# Patient Record
Sex: Male | Born: 1960 | Race: White | Hispanic: No | Marital: Married | Smoking: Current every day smoker
Health system: Southern US, Community
[De-identification: ages and names within clinical notes are randomized; demographics above are authoritative.]

## PROBLEM LIST (undated history)

## (undated) DIAGNOSIS — I10 Essential (primary) hypertension: Secondary | ICD-10-CM

## (undated) DIAGNOSIS — G510 Bell's palsy: Secondary | ICD-10-CM

## (undated) HISTORY — PX: TONSILLECTOMY: SUR1361

---

## 2005-06-25 ENCOUNTER — Ambulatory Visit: Payer: Self-pay | Admitting: Family Medicine

## 2005-06-29 ENCOUNTER — Ambulatory Visit: Payer: Self-pay | Admitting: Family Medicine

## 2005-07-14 ENCOUNTER — Ambulatory Visit: Payer: Self-pay | Admitting: Family Medicine

## 2010-05-31 ENCOUNTER — Emergency Department (HOSPITAL_COMMUNITY)
Admission: EM | Admit: 2010-05-31 | Discharge: 2010-05-31 | Payer: Self-pay | Source: Home / Self Care | Admitting: Emergency Medicine

## 2014-12-17 ENCOUNTER — Emergency Department: Payer: Self-pay

## 2014-12-17 ENCOUNTER — Encounter: Payer: Self-pay | Admitting: Emergency Medicine

## 2014-12-17 ENCOUNTER — Other Ambulatory Visit: Payer: Self-pay

## 2014-12-17 ENCOUNTER — Emergency Department
Admission: EM | Admit: 2014-12-17 | Discharge: 2014-12-17 | Disposition: A | Discharge status: Home / Self Care | Payer: Self-pay | Attending: Emergency Medicine | Admitting: Emergency Medicine

## 2014-12-17 DIAGNOSIS — Z880 Allergy status to penicillin: Secondary | ICD-10-CM | POA: Insufficient documentation

## 2014-12-17 DIAGNOSIS — R0789 Other chest pain: Secondary | ICD-10-CM | POA: Insufficient documentation

## 2014-12-17 LAB — TROPONIN I
Troponin I: <0.03 ng/mL (ref <0.031)
Troponin I: <0.03 ng/mL (ref <0.031)

## 2014-12-17 LAB — BASIC METABOLIC PANEL
ANION GAP: 10 (ref 5–15)
BUN: 14 mg/dL (ref 6–20)
CALCIUM: 10.3 mg/dL (ref 8.9–10.3)
CO2: 24 mmol/L (ref 22–32)
CREATININE: 0.91 mg/dL (ref 0.61–1.24)
Chloride: 105 mmol/L (ref 101–111)
Glucose, Bld: 121 mg/dL — ABNORMAL HIGH (ref 65–99)
Potassium: 3.7 mmol/L (ref 3.5–5.1)
SODIUM: 139 mmol/L (ref 135–145)

## 2014-12-17 LAB — CBC
HCT: 44.9 % (ref 40.0–52.0)
HEMOGLOBIN: 15.7 g/dL (ref 13.0–18.0)
MCH: 34.6 pg — ABNORMAL HIGH (ref 26.0–34.0)
MCHC: 35 g/dL (ref 32.0–36.0)
MCV: 98.7 fL (ref 80.0–100.0)
Platelets: 225 10*3/uL (ref 150–440)
RBC: 4.54 MIL/uL (ref 4.40–5.90)
RDW: 13.1 % (ref 11.5–14.5)
WBC: 6.9 10*3/uL (ref 3.8–10.6)

## 2014-12-17 MED ORDER — ASPIRIN 81 MG PO CHEW
162.0000 mg | CHEWABLE_TABLET | Freq: Once | ORAL | Status: AC
  Administered 2014-12-17: 162 mg via ORAL

## 2014-12-17 MED ORDER — ASPIRIN 81 MG PO CHEW
CHEWABLE_TABLET | ORAL | Status: AC
  Administered 2014-12-17: 162 mg via ORAL
  Filled 2014-12-17: qty 2

## 2014-12-17 NOTE — ED Provider Notes (Signed)
[facility] Emergency Department Provider Note ____________________________________________  Time seen: Approximately 3:36 PM  I have reviewed the triage vital signs and the nursing notes.   HISTORY  Chief Complaint Chest Pain    HPI Dale Bentley is a 54 y.o. male with history of hypertension, not recently treated, who presents for evaluation of left-sided chest pain that began last night at 10 PM while he was eating a piece of fried chicken. The pain came on gradually and became sharp. He also had reflux symptoms with a sour taste in his mouth that resolved after he drank tea. It was associated with diaphoresis so he took a shower. He denied associated shortness of breath or nausea. When the chest pain resolved he couldn't get comfortable because he felt restless. He also noted abdominal bloating and discomfort. He pushed on his abdomen and said it "felt tight" this has subsequently resolved. He finally fell asleep at 6 AM then had to wake shortly after for work. Did not have chest pain at that time but when he got to work he was hot and sweaty. He works as a [employer] fast streamer and was inside a house with poor air conditioning. Then around 10 AM he developed left-sided chest pain that he described as "light" as if he could feel "something there "; he states it lasted "no time "and is pain-free at this time. He denied chest pressure or tightness with the episodes. The pain has never radiated. He has never had similar symptoms. He did take a BC powder this morning. He did not eat breakfast because he was worried his stomach was too tight which is unusual for him as he normally eats breakfast every day.  He has felt recently well and denies any fever, URI, night sweats. He does smoke. His father had an MI around this age and required angioplasty and a stent 10 years later.  History reviewed. No pertinent past medical history. patient notes he was treated for  hypertension a long time ago with HCTZ and lisinopril but was unable to tolerate the side effects. He has not seen a doctor in many years.  There are no active problems to display for this patient.   History reviewed. No pertinent past surgical history.  No current outpatient prescriptions on file.  Allergies Penicillins  History reviewed. No pertinent family history.  Social History History  Substance Use Topics  . Smoking status: Never Smoker   . Smokeless tobacco: Never Used  . Alcohol Use: Yes    Review of Systems Constitutional: No fever/chills ENT: No URI Musculoskeletal: Negative for back pain. Neurological: Negative for headaches Endocrine:No recent weight change 10-point ROS otherwise negative.  ____________________________________________   PHYSICAL EXAM:  VITAL SIGNS: ED Triage Vitals  Enc Vitals Group     BP 12/17/14 1203 164/99 mmHg     Pulse Rate 12/17/14 1203 86     Resp 12/17/14 1203 20     Temp 12/17/14 1203 97.4 F (36.3 C)     Temp Source 12/17/14 1203 Oral     SpO2 12/17/14 1203 97 %     Weight 12/17/14 1203 205 lb (92.987 kg)     Height 12/17/14 1203 6' (1.829 m)     Head Cir --      Peak Flow --      Pain Score 12/17/14 1205 2     Pain Loc --      Pain Edu? --      Excl. in GC? --  Constitutional: Alert and oriented. Well appearing and in no acute distress. Eyes: Conjunctivae are normal. PERRL. EOMI. Head: Atraumatic. Nose: No congestion/rhinnorhea. Mouth/Throat: Mucous membranes are slightly dry.  Oropharynx non-erythematous. Neck: No stridor.   Lymphatic: No cervical lymphadenopathy. Cardiovascular: Normal rate, regular rhythm. Grossly normal heart sounds.  Peripheral pulses 2+ B Respiratory: Normal respiratory effort.  No retractions. Lungs CTAB. Gastrointestinal: Soft and nontender. No distention. Normal bowel sounds.  Musculoskeletal: No lower extremity tenderness nor edema.  No calf TTP. Neurologic:  Normal speech and  language. No gross focal neurologic deficits are appreciated. Speech is normal.  Skin:  Skin is warm, dry and intact. No rash noted. Psychiatric: Mood and affect are normal. Speech and behavior are normal.  ____________________________________________   LABS (all labs ordered are listed, but only abnormal results are displayed)  Labs Reviewed  CBC - Abnormal; Notable for the following:    MCH 34.6 (*)    All other components within normal limits  BASIC METABOLIC PANEL - Abnormal; Notable for the following:    Glucose, Bld 121 (*)    All other components within normal limits  TROPONIN I  TROPONIN I   ____________________________________________  EKG   Date: 12/17/2014; 1204  Rate: 82  Rhythm: normal sinus rhythm  QRS Axis: normal  Intervals: normal  ST/T Wave abnormalities: normal  Conduction Disutrbances: none  Narrative Interpretation: unremarkable  ____________________________________________  RADIOLOGY  CXR-NAD ____________________________________________   PROCEDURES  Procedure(s) performed: none  Critical Care performed: none ____________________________________________   INITIAL IMPRESSION / ASSESSMENT AND PLAN / ED COURSE  Pertinent labs & imaging results that were available during my care of the patient were reviewed by me and considered in my medical decision making (see chart for details).  H&P atypical for ACS but will rule out with 2 sets of troponin given risk factors of smoking and family history and possible untreated hypertension.  Not c/w Aortic pathology. Unclear how long-standing HTN is; will defer Tx to outpt.  Sx more c/w GI pathology but given Hx, feel cardiac stress test outpt should be considered.  ----------------------------------------- 4:50 PM on 12/17/2014 -----------------------------------------  Spoke with Dr. Lady Gary, [facility] cards, who will see patient in follow up Wednesday at 10:30 AM. Patient reassessed. He feels well and is  anxious to go home. Understands strict return precautions and need to return if pain recurs for further evaluation. ____________________________________________   FINAL CLINICAL IMPRESSION(S) / ED DIAGNOSES  Chest pain   Maurilio Lovely, MD 12/17/14 1655

## 2014-12-17 NOTE — Discharge Instructions (Signed)
Take Aspirin 162 mg daily until otherwise directed by Dr. Lady GaryFath.  Do not do strenuous activity until cleared by him.  Return to the ER if you have recurrent pain, sweating, difficulty breathing, nausea, or other concerns.  Keep your appointment with DR. Fath Weds at 10:30am.  Chest Pain (Nonspecific) It is often hard to give a specific diagnosis for the cause of chest pain. There is always a chance that your pain could be related to something serious, such as a heart attack or a blood clot in the lungs. You need to follow up with your health care provider for further evaluation. CAUSES   Heartburn.  Pneumonia or bronchitis.  Anxiety or stress.  Inflammation around your heart (pericarditis) or lung (pleuritis or pleurisy).  A blood clot in the lung.  A collapsed lung (pneumothorax). It can develop suddenly on its own (spontaneous pneumothorax) or from trauma to the chest.  Shingles infection (herpes zoster virus). The chest wall is composed of bones, muscles, and cartilage. Any of these can be the source of the pain.  The bones can be bruised by injury.  The muscles or cartilage can be strained by coughing or overwork.  The cartilage can be affected by inflammation and become sore (costochondritis). DIAGNOSIS  Lab tests or other studies may be needed to find the cause of your pain. Your health care provider may have you take a test called an ambulatory electrocardiogram (ECG). An ECG records your heartbeat patterns over a 24-hour period. You may also have other tests, such as:  Transthoracic echocardiogram (TTE). During echocardiography, sound waves are used to evaluate how blood flows through your heart.  Transesophageal echocardiogram (TEE).  Cardiac monitoring. This allows your health care provider to monitor your heart rate and rhythm in real time.  Holter monitor. This is a portable device that records your heartbeat and can help diagnose heart arrhythmias. It allows your health  care provider to track your heart activity for several days, if needed.  Stress tests by exercise or by giving medicine that makes the heart beat faster. TREATMENT   Treatment depends on what may be causing your chest pain. Treatment may include:  Acid blockers for heartburn.  Anti-inflammatory medicine.  Pain medicine for inflammatory conditions.  Antibiotics if an infection is present.  You may be advised to change lifestyle habits. This includes stopping smoking and avoiding alcohol, caffeine, and chocolate.  You may be advised to keep your head raised (elevated) when sleeping. This reduces the chance of acid going backward from your stomach into your esophagus. Most of the time, nonspecific chest pain will improve within 2-3 days with rest and mild pain medicine.  HOME CARE INSTRUCTIONS   If antibiotics were prescribed, take them as directed. Finish them even if you start to feel better.  For the next few days, avoid physical activities that bring on chest pain. Continue physical activities as directed.  Do not use any tobacco products, including cigarettes, chewing tobacco, or electronic cigarettes.  Avoid drinking alcohol.  Only take medicine as directed by your health care provider.  Follow your health care provider's suggestions for further testing if your chest pain does not go away.  Keep any follow-up appointments you made. If you do not go to an appointment, you could develop lasting (chronic) problems with pain. If there is any problem keeping an appointment, call to reschedule. SEEK MEDICAL CARE IF:   Your chest pain does not go away, even after treatment.  You have a rash  with blisters on your chest. °· You have a fever. °SEEK IMMEDIATE MEDICAL CARE IF:  °· You have increased chest pain or pain that spreads to your arm, neck, jaw, back, or abdomen. °· You have shortness of breath. °· You have an increasing cough, or you cough up blood. °· You have severe back or  abdominal pain. °· You feel nauseous or vomit. °· You have severe weakness. °· You faint. °· You have chills. °This is an emergency. Do not wait to see if the pain will go away. Get medical help at once. Call your local emergency services (911 in [place] Do not drive yourself to the hospital. °MAKE SURE YOU:  °· Understand these instructions. °· Will watch your condition. °· Will get help right away if you are not doing well or get worse. °Document Released: 03/18/2005 Document Revised: 06/13/2013 Document Reviewed: 01/12/2008 °[employer]® Patient Information ©2015 [employer]. This information is not intended to replace advice given to you by your health care provider. Make sure you discuss any questions you have with your health care provider. ° °

## 2014-12-17 NOTE — ED Notes (Signed)
Patient to ED with c/o left of center chest pain that started last night and lasted most of the night. Reports he was unable to get comfortable for most of the night and did get diaphoretic at one point. Reports that this morning pain is much less than it was.

## 2014-12-26 ENCOUNTER — Other Ambulatory Visit: Payer: Self-pay

## 2014-12-26 ENCOUNTER — Emergency Department: Payer: Self-pay

## 2014-12-26 ENCOUNTER — Encounter: Payer: Self-pay | Admitting: General Practice

## 2014-12-26 ENCOUNTER — Emergency Department
Admission: EM | Admit: 2014-12-26 | Discharge: 2014-12-26 | Disposition: A | Discharge status: Home / Self Care | Payer: Self-pay | Attending: Emergency Medicine | Admitting: Emergency Medicine

## 2014-12-26 DIAGNOSIS — R0602 Shortness of breath: Secondary | ICD-10-CM | POA: Insufficient documentation

## 2014-12-26 DIAGNOSIS — Z720 Tobacco use: Secondary | ICD-10-CM | POA: Insufficient documentation

## 2014-12-26 DIAGNOSIS — Z880 Allergy status to penicillin: Secondary | ICD-10-CM | POA: Insufficient documentation

## 2014-12-26 DIAGNOSIS — Z79899 Other long term (current) drug therapy: Secondary | ICD-10-CM | POA: Insufficient documentation

## 2014-12-26 DIAGNOSIS — R0789 Other chest pain: Secondary | ICD-10-CM | POA: Insufficient documentation

## 2014-12-26 DIAGNOSIS — Z7982 Long term (current) use of aspirin: Secondary | ICD-10-CM | POA: Insufficient documentation

## 2014-12-26 HISTORY — DX: Essential (primary) hypertension: I10

## 2014-12-26 LAB — BASIC METABOLIC PANEL
ANION GAP: 14 (ref 5–15)
BUN: 15 mg/dL (ref 6–20)
CALCIUM: 10.4 mg/dL — AB (ref 8.9–10.3)
CO2: 22 mmol/L (ref 22–32)
Chloride: 104 mmol/L (ref 101–111)
Creatinine, Ser: 0.98 mg/dL (ref 0.61–1.24)
GFR calc Af Amer: >60 mL/min (ref >60)
GFR calc non Af Amer: >60 mL/min (ref >60)
Glucose, Bld: 115 mg/dL — ABNORMAL HIGH (ref 65–99)
Potassium: 3.6 mmol/L (ref 3.5–5.1)
SODIUM: 140 mmol/L (ref 135–145)

## 2014-12-26 LAB — CBC
HCT: 46.8 % (ref 40.0–52.0)
Hemoglobin: 16 g/dL (ref 13.0–18.0)
MCH: 33.8 pg (ref 26.0–34.0)
MCHC: 34.3 g/dL (ref 32.0–36.0)
MCV: 98.7 fL (ref 80.0–100.0)
PLATELETS: 238 10*3/uL (ref 150–440)
RBC: 4.74 MIL/uL (ref 4.40–5.90)
RDW: 12.9 % (ref 11.5–14.5)
WBC: 8.9 10*3/uL (ref 3.8–10.6)

## 2014-12-26 LAB — TROPONIN I

## 2014-12-26 MED ORDER — IPRATROPIUM-ALBUTEROL 0.5-2.5 (3) MG/3ML IN SOLN
3.0000 mL | Freq: Once | RESPIRATORY_TRACT | Status: AC
  Administered 2014-12-26: 3 mL via RESPIRATORY_TRACT

## 2014-12-26 MED ORDER — GI COCKTAIL ~~LOC~~
ORAL | Status: AC
  Administered 2014-12-26: 30 mL via ORAL
  Filled 2014-12-26: qty 30

## 2014-12-26 MED ORDER — SODIUM CHLORIDE 0.9 % IV BOLUS (SEPSIS)
1000.0000 mL | Freq: Once | INTRAVENOUS | Status: AC
  Administered 2014-12-26: 1000 mL via INTRAVENOUS

## 2014-12-26 MED ORDER — FAMOTIDINE 20 MG PO TABS
40.0000 mg | ORAL_TABLET | Freq: Once | ORAL | Status: AC
  Administered 2014-12-26: 40 mg via ORAL

## 2014-12-26 MED ORDER — SUCRALFATE 1 G PO TABS: 1.0000 g | ORAL_TABLET | Freq: Four times a day (QID) | ORAL | Status: DC | PRN

## 2014-12-26 MED ORDER — LISINOPRIL 40 MG PO TABS: 40.0000 mg | ORAL_TABLET | Freq: Every day | ORAL | Status: DC

## 2014-12-26 MED ORDER — LISINOPRIL 20 MG PO TABS: 20.0000 mg | ORAL_TABLET | Freq: Every day | ORAL | Status: DC

## 2014-12-26 MED ORDER — RANITIDINE HCL 150 MG PO CAPS: 150.0000 mg | ORAL_CAPSULE | Freq: Two times a day (BID) | ORAL | Status: DC

## 2014-12-26 MED ORDER — FAMOTIDINE 20 MG PO TABS
ORAL_TABLET | ORAL | Status: AC
  Administered 2014-12-26: 40 mg via ORAL
  Filled 2014-12-26: qty 2

## 2014-12-26 MED ORDER — IPRATROPIUM-ALBUTEROL 0.5-2.5 (3) MG/3ML IN SOLN
RESPIRATORY_TRACT | Status: AC
  Administered 2014-12-26: 3 mL via RESPIRATORY_TRACT
  Filled 2014-12-26: qty 9

## 2014-12-26 MED ORDER — GI COCKTAIL ~~LOC~~
30.0000 mL | ORAL | Status: AC
  Administered 2014-12-26: 30 mL via ORAL

## 2014-12-26 NOTE — ED Provider Notes (Addendum)
[facility] Emergency Department Provider Note  ____________________________________________  Time seen: 11:15 AM  I have reviewed the triage vital signs and the nursing notes.   HISTORY  Chief Complaint Shortness of Breath and Chest Pain    HPI Dale Bentley is a 54 y.o. male who complains of shortness of breath and left lower chest pain since this morning around 10:00. 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 He went down in the basement where there was lots of dust and other airborne irritants, after being in that environment for several minutes he began to feel short of breath. He left there immediately. He still feels little bit short of breath although it is improving.  She also reports pain in the epigastrium and left upper quadrant that radiates upward into the lower left chest. He does note that he likes eat a lot of acidic foods including dairy and tomatoes. The pain is not exertional and not pleuritic. It's burning. No diaphoresis or vomiting. No radiation other than as described. Episodic in moderate intensity  He started lisinopril for hypertension 1 week ago. He's been compliant with this therapy but his blood pressure remains elevated.       Past Medical History  Diagnosis Date  . Hypertension     There are no active problems to display for this patient.   History reviewed. No pertinent past surgical history.  Current Outpatient Rx  Name  Route  Sig  Dispense  Refill  . aspirin EC 81 MG tablet   Oral   Take 81 mg by mouth daily.         Marland Kitchen lisinopril (PRINIVIL,ZESTRIL) 20 MG tablet   Oral   Take 20 mg by mouth daily.      1   . ranitidine (ZANTAC) 150 MG capsule   Oral   Take 1 capsule (150 mg total) by mouth 2 (two) times daily.   60 capsule   0   . sucralfate (CARAFATE) 1 G tablet   Oral   Take 1 tablet (1 g total) by mouth 4 (four)  times daily as needed.   120 tablet   1     Allergies Penicillins  No family history on file.  Social History History  Substance Use Topics  . Smoking status: Current Every Day Smoker -- 1.00 packs/day    Types: Cigarettes  . Smokeless tobacco: Never Used  . Alcohol Use: Yes    Review of Systems  Constitutional: No fever or chills. No weight changes Eyes:No blurry vision or double vision.  ENT: No sore throat. Cardiovascular:  positive chest pain as above . Respiratory:  positive shortness of breath as above, no coughh. Gastrointestinal: Negative for abdominal pain, vomiting and diarrhea.  No BRBPR or melena. Genitourinary: Negative for dysuria, urinary retention, bloody urine, or difficulty urinating. Musculoskeletal: Negative for back pain. No joint swelling or pain. Skin: Negative for rash. Neurological: Negative for headaches, focal weakness or numbness. Psychiatric:No anxiety or depression.   Endocrine:No hot/cold intolerance, changes in energy, or sleep difficulty.  10-point ROS otherwise negative.  ____________________________________________   PHYSICAL EXAM:  VITAL SIGNS: ED Triage Vitals  Enc Vitals Group     BP 12/26/14 1125 161/105 mmHg     Pulse Rate 12/26/14 1125 91     Resp 12/26/14 1125 18     Temp 12/26/14 1125 97.5 F (36.4 C)  Temp Source 12/26/14 1125 Oral     SpO2 12/26/14 1125 100 %     Weight 12/26/14 1125 205 lb (92.987 kg)     Height 12/26/14 1125 5\' 10"  (1.778 m)     Head Cir --      Peak Flow --      Pain Score 12/26/14 1125 4     Pain Loc --      Pain Edu? --      Excl. in GC? --      Constitutional: Alert and oriented. Well appearing and in no distress. Eyes: No scleral icterus. No conjunctival pallor. PERRL. EOMI ENT   Head: Normocephalic and atraumatic.   Nose: No congestion/rhinnorhea. No septal hematoma   Mouth/Throat: MMM,  mildal erythema. No peritonsillar mass. No uvula shift.   Neck: No stridor.  No SubQ emphysema. No meningismus. Hematological/Lymphatic/Immunilogical: No cervical lymphadenopathy. Cardiovascular: RRR. Normal and symmetric distal pulses are present in all extremities. No murmurs, rubs, or gallops. Respiratory: Normal respiratory effort without tachypnea nor retractions. Breath sounds are clear and equal bilaterally. No wheezes/rales/rhonchi. GastrointestinalMild left upper quadrant tenderness. No distention. There is no CVA tenderness.  No rebound, rigidity, or guarding. Genitourinary: deferred Musculoskeletal: Nontender with normal range of motion in all extremities. No joint effusions.  No lower extremity tenderness.  No edema. Neurologic:   Normal speech and language.  CN 2-10 normal. Motor grossly intact. No pronator drift.  Normal gait. No gross focal neurologic deficits are appreciated.  Skin:  Skin is warm, dry and intact. No rash noted.  No petechiae, purpura, or bullae. Psychiatric: Mood and affect are normal. Speech and behavior are normal. Patient exhibits appropriate insight and judgment.  ____________________________________________    LABS (pertinent positives/negatives) (all labs ordered are listed, but only abnormal results are displayed) Labs Reviewed  BASIC METABOLIC PANEL - Abnormal; Notable for the following:    Glucose, Bld 115 (*)    Calcium 10.4 (*)    All other components within normal limits  CBC  TROPONIN I   ____________________________________________   EKG  Interpreted by me  Date: 12/26/2014  Rate: 88  Rhythm: normal sinus rhythm  QRS Axis: normal  Intervals: normal  ST/T Wave abnormalities: normal  Conduction Disutrbances: none  Narrative Interpretation: unremarkable      ____________________________________________    RADIOLOGY  Chest x-ray unremarkable   ____________________________________________   PROCEDURES  ____________________________________________   INITIAL IMPRESSION / ASSESSMENT AND PLAN  / ED COURSE  Pertinent labs & imaging results that were available during my care of the patient were reviewed by me and considered in my medical decision making (see chart for details).  Patient presents with symptoms of likely 2 different issues. This appears to be GERD and environmental exposure to inhalational Uritin such as dust. Based on history and physical and past medical history, the patient is at low risk for ACS PE TAD pneumothorax carditis mediastinitis. Heart score is low risk. We'll check labs and give antacids.   ----------------------------------------- 2:11 PM on 12/26/2014 -----------------------------------------  Patient feels much better, symptoms resolved. Abdomen is completely soft nontender nondistended and benign at this time. Low suspicion for biliary pathology or pancreatitis. This appears to be gastritis and acid reflux primarily, which has improved. I'll start him on a course of Carafate and Zantac have him follow-up with his cardial just Dr. Lady GaryFath.  I recommended the patient go ahead and double his lisinopril from 20 mg daily to 40 mg daily as he's been on it for a week  without much improvement in his blood pressure.  ____________________________________________   FINAL CLINICAL IMPRESSION(S) / ED DIAGNOSES  Final diagnoses:  Shortness of breath  Chronic essential hypertension Gastritis and gastroesophageal reflux disease   Sharman Cheek, MD 12/26/14 1412  Sharman Cheek, MD 12/26/14 [phone]

## 2014-12-26 NOTE — ED Notes (Signed)
Pt. Arrived to ed from home with reprots of experiencing SOB and left side chest pain. Pt alert and oriented. Anxious in triage. Pt reports discomfort started this AM. Pt. Verbalized pain as "just pain" Pt reports relief of pain since this AM.

## 2016-04-07 IMAGING — CR DG CHEST 1V PORT
1 series · 1 of 1 positions shown · non-contrast
Comparison: Chest x-ray 12/17/2014.

CLINICAL DATA: 53-year-old male with shortness of breath and
left-sided chest pain.

EXAM:
PORTABLE CHEST - 1 VIEW

[portable]
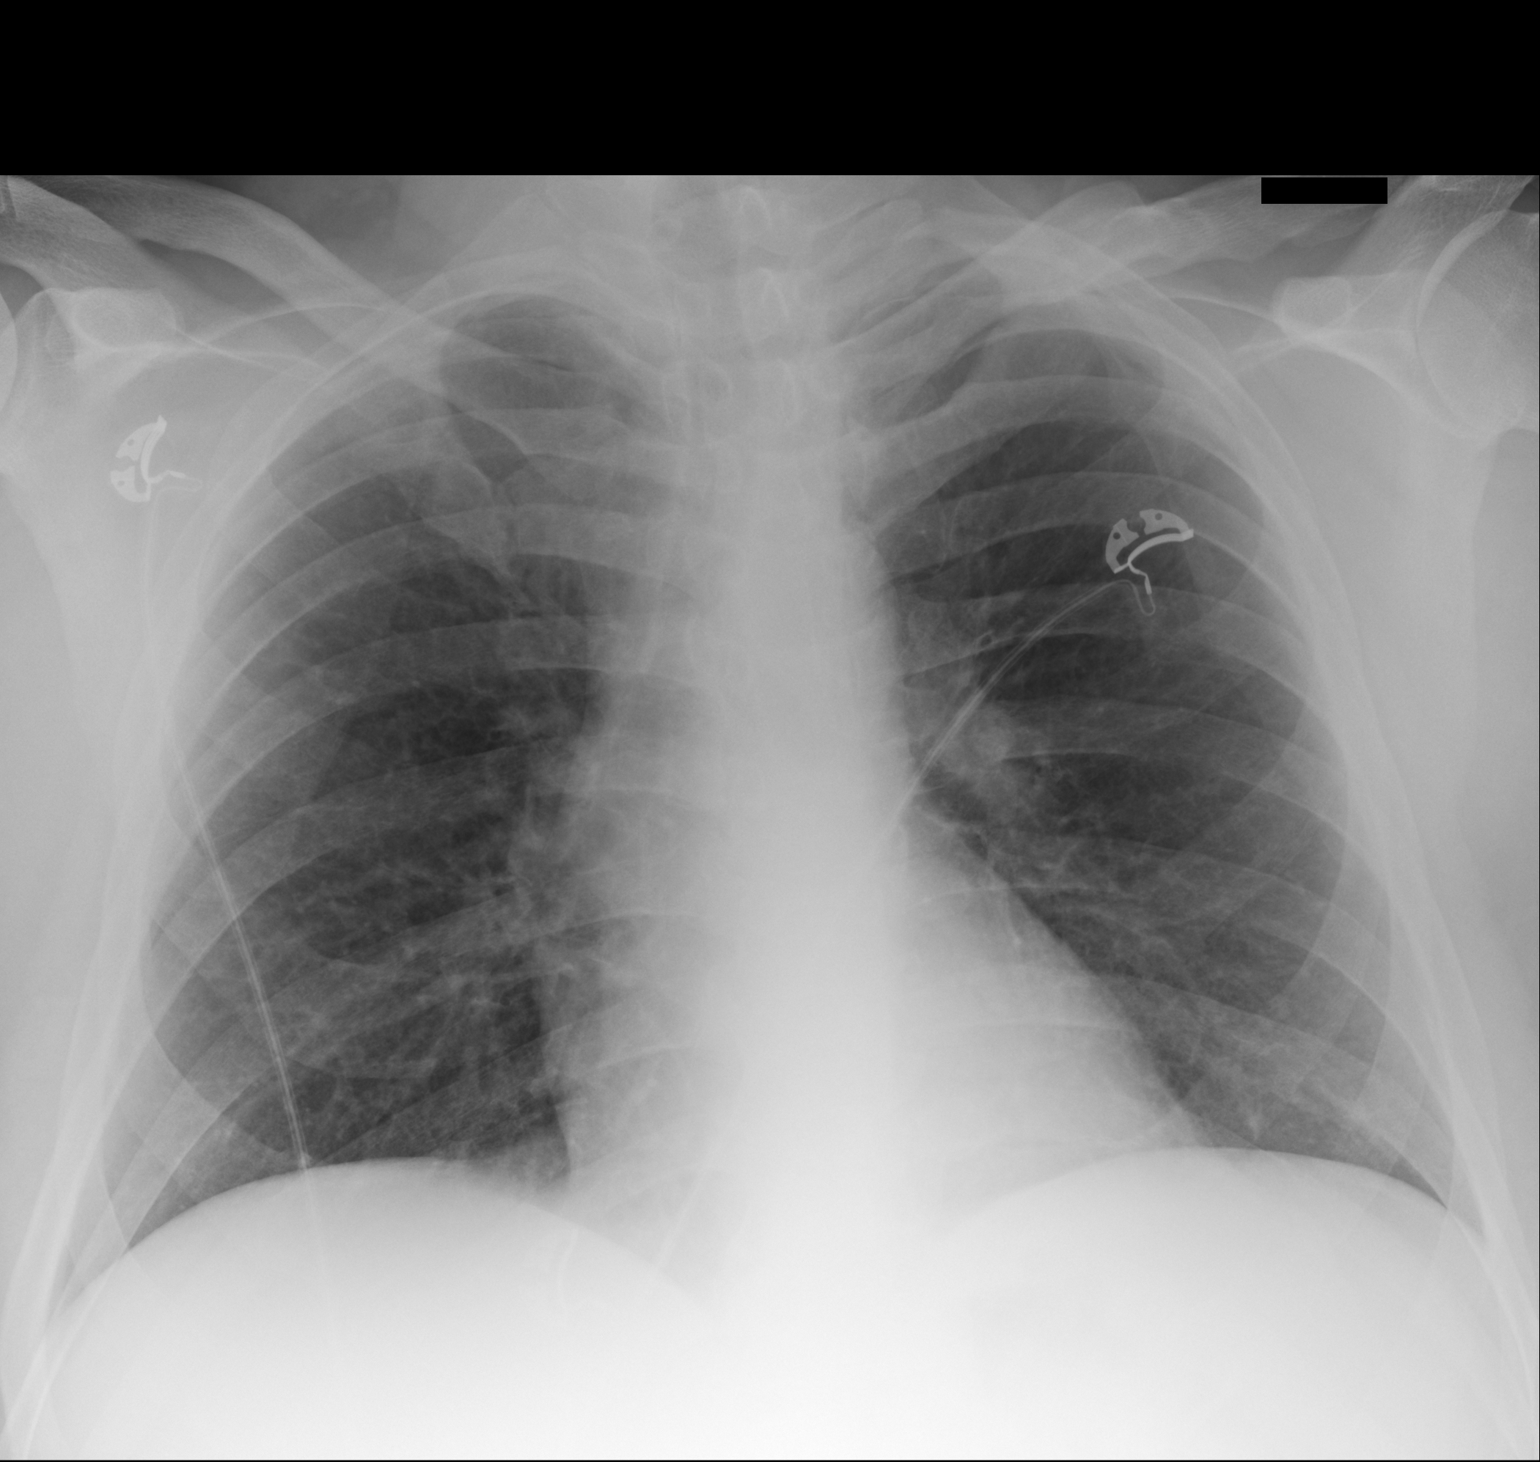

[1 of 1 positions shown; findings below may reference images not displayed]

FINDINGS: Lung volumes are normal. No consolidative airspace disease. No
pleural effusions. No pneumothorax. No pulmonary nodule or mass
noted. Pulmonary vasculature and the cardiomediastinal silhouette
are within normal limits.
IMPRESSION: No radiographic evidence of acute cardiopulmonary disease.

## 2018-04-08 ENCOUNTER — Emergency Department: Payer: Self-pay

## 2018-04-08 ENCOUNTER — Other Ambulatory Visit: Payer: Self-pay

## 2018-04-08 ENCOUNTER — Emergency Department
Admission: EM | Admit: 2018-04-08 | Discharge: 2018-04-08 | Disposition: A | Discharge status: Home / Self Care | Payer: Self-pay | Attending: Emergency Medicine | Admitting: Emergency Medicine

## 2018-04-08 ENCOUNTER — Encounter: Payer: Self-pay | Admitting: Emergency Medicine

## 2018-04-08 DIAGNOSIS — F17210 Nicotine dependence, cigarettes, uncomplicated: Secondary | ICD-10-CM | POA: Insufficient documentation

## 2018-04-08 DIAGNOSIS — W11XXXA Fall on and from ladder, initial encounter: Secondary | ICD-10-CM | POA: Insufficient documentation

## 2018-04-08 DIAGNOSIS — I10 Essential (primary) hypertension: Secondary | ICD-10-CM | POA: Insufficient documentation

## 2018-04-08 DIAGNOSIS — Y999 Unspecified external cause status: Secondary | ICD-10-CM | POA: Insufficient documentation

## 2018-04-08 DIAGNOSIS — Z79899 Other long term (current) drug therapy: Secondary | ICD-10-CM | POA: Insufficient documentation

## 2018-04-08 DIAGNOSIS — S92002A Unspecified fracture of left calcaneus, initial encounter for closed fracture: Secondary | ICD-10-CM | POA: Insufficient documentation

## 2018-04-08 DIAGNOSIS — S92009A Unspecified fracture of unspecified calcaneus, initial encounter for closed fracture: Secondary | ICD-10-CM

## 2018-04-08 DIAGNOSIS — Y929 Unspecified place or not applicable: Secondary | ICD-10-CM | POA: Insufficient documentation

## 2018-04-08 DIAGNOSIS — Z7982 Long term (current) use of aspirin: Secondary | ICD-10-CM | POA: Insufficient documentation

## 2018-04-08 DIAGNOSIS — Y9389 Activity, other specified: Secondary | ICD-10-CM | POA: Insufficient documentation

## 2018-04-08 MED ORDER — HYDROCODONE-ACETAMINOPHEN 5-325 MG PO TABS: 1.0000 | ORAL_TABLET | ORAL | 0 refills | Status: AC | PRN

## 2018-04-08 MED ORDER — MELOXICAM 7.5 MG PO TABS
15.0000 mg | ORAL_TABLET | Freq: Once | ORAL | Status: AC
  Administered 2018-04-08: 15 mg via ORAL
  Filled 2018-04-08: qty 2

## 2018-04-08 MED ORDER — OXYCODONE HCL 5 MG PO TABS
5.0000 mg | ORAL_TABLET | Freq: Once | ORAL | Status: AC
  Administered 2018-04-08: 5 mg via ORAL
  Filled 2018-04-08: qty 1

## 2018-04-08 NOTE — Discharge Instructions (Signed)
Keep the foot elevated above your heart.  Lay an ice pack over the splint but please be careful and not get the splint wet. Monday morning, call Dr. Irene Limbo office for an appointment. Return to the emergency department for any symptom of concern unless you are able to see someone in Dr. Irene Limbo office right away.

## 2018-04-08 NOTE — ED Triage Notes (Signed)
Pt arrived via POV with reports of fall from ladder, pt states the ladder started closing down and the patient went off backward, bending his left foot awkwardly.    Pt denies hitting his head and had no LOC.  Pt states he did hit his back, but does not have pain in his back. Pt unable to bear weight to left foot.

## 2018-04-08 NOTE — ED Notes (Signed)

## 2018-04-08 NOTE — ED Notes (Signed)
Pt states they own a pair of crutches and family is bringing them in for staff to set to correct height. Pt refused our crutches. This EDT will check crutches once other

## 2018-04-08 NOTE — ED Provider Notes (Signed)
[facility] Emergency Department Provider Note ____________________________________________  Time seen: Approximately 4:07 PM  I have reviewed the triage vital signs and the nursing notes.   HISTORY  Chief Complaint Ankle Pain    HPI ARLAND USERY is a 57 y.o. male who presents to the emergency department for evaluation and treatment of left heel pain.  He was up on a ladder and it started closing down and he landed awkwardly on his left foot then fell over onto his back.  He denies back pain.  No alleviating measures attempted prior to arrival.  No previous ankle fracture.  He denies striking his head or experiencing any loss of consciousness.  Past Medical History:  Diagnosis Date  . Hypertension     There are no active problems to display for this patient.   History reviewed. No pertinent surgical history.  Prior to Admission medications   Medication Sig Start Date End Date Taking? Authorizing Provider  aspirin EC 81 MG tablet Take 81 mg by mouth daily.    [provider]  HYDROcodone-acetaminophen (NORCO/VICODIN) 5-325 MG tablet Take 1 tablet by mouth every 4 (four) hours as needed for moderate pain. 04/08/18 04/08/19  Antoine Fiallos, Rulon Eisenmenger B, FNP  lisinopril (PRINIVIL,ZESTRIL) 20 MG tablet Take 1 tablet (20 mg total) by mouth daily. 12/26/14   Sharman Cheek, MD  lisinopril (PRINIVIL,ZESTRIL) 40 MG tablet Take 1 tablet (40 mg total) by mouth daily. 12/26/14   Sharman Cheek, MD  ranitidine (ZANTAC) 150 MG capsule Take 1 capsule (150 mg total) by mouth 2 (two) times daily. 12/26/14   Sharman Cheek, MD  sucralfate (CARAFATE) 1 G tablet Take 1 tablet (1 g total) by mouth 4 (four) times daily as needed. 12/26/14   Sharman Cheek, MD    Allergies Penicillins  No family history on file.  Social History Social History   Tobacco Use  . Smoking status: Current Every Day Smoker    Packs/day: 1.00    Types: Cigarettes  . Smokeless tobacco:  Never Used  Substance Use Topics  . Alcohol use: Yes  . Drug use: No    Review of Systems Constitutional: Negative for fever. Cardiovascular: Negative for chest pain. Respiratory: Negative for shortness of breath. Musculoskeletal: Positive for left foot and ankle pain. Skin: Positive for abrasion to the left forearm. Neurological: Negative for decrease in sensation  ____________________________________________   PHYSICAL EXAM:  VITAL SIGNS: ED Triage Vitals [04/08/18 1546]  Enc Vitals Group     BP (!) 149/103     Pulse Rate 85     Resp 20     Temp (!) 97.4 F (36.3 C)     Temp Source Oral     SpO2 100 %     Weight 215 lb (97.5 kg)     Height 5\' 11"  (1.803 m)     Head Circumference      Peak Flow      Pain Score 8     Pain Loc      Pain Edu?      Excl. in GC?     Constitutional: Alert and oriented. Well appearing and in no acute distress. Eyes: Conjunctivae are clear without discharge or drainage Head: Atraumatic Neck: No focal midline tenderness. Respiratory: No cough. Respirations are even and unlabored. Musculoskeletal: No focal midline tenderness of the lumbar or thoracic spine.  No pain on hip squeeze.  No pain with palpation over either femur or knee.  There is no focal tenderness over the head  of the fibula on either side.  No obvious bony abnormality of either ankle, foot, or toes.  Thompson's test of the left ower extremity is negative. Neurologic: Awake, alert, oriented x4. Skin: Superficial abrasion noted to the anterior aspect of the left forearm.  No active bleeding. Psychiatric: Affect and behavior are appropriate.  ____________________________________________   LABS (all labs ordered are listed, but only abnormal results are displayed)  Labs Reviewed - No data to display ____________________________________________  RADIOLOGY  Image of the left foot confirms suspected calcaneal fracture with an associated joint effusion and soft tissue  swelling. ____________________________________________   PROCEDURES  .Splint Application Date/Time: 04/08/2018 7:01 PM Performed by: Chinita Pester, FNP Authorized by: Chinita Pester, FNP   Consent:    Consent obtained:  Verbal   Consent given by:  Patient   Risks discussed:  Numbness, pain and swelling Pre-procedure details:    Sensation:  Normal Procedure details:    Laterality:  Left   Location:  Ankle   Ankle:  L ankle   Splint type:  Short leg   Supplies:  Elastic bandage, cotton padding and Ortho-Glass    ____________________________________________   INITIAL IMPRESSION / ASSESSMENT AND PLAN / ED COURSE  KOLTAN PORTOCARRERO is a 57 y.o. who presents to the emergency department for treatment and evaluation after falling while the ladder he was standing on collapsed.  Overall exam is reassuring, however there is concern for left calcaneal fracture.  We will start with x-rays.  ----------------------------------------- 4:49 PM on 04/08/2018 -----------------------------------------  Image results reviewed with the patient and coworker.  He is aware that he is scheduled to have a CT scan.  Because of the high axial load and calcaneal fracture images of the cervical, thoracic, lumbar spine have been ordered as well.  ----------------------------------------- 7:02 PM on 04/08/2018 -----------------------------------------  Case discussed with Dr. Ether Griffins who will see the patient as an outpatient early next week.  OCL applied per his instruction.  Patient will be discharged home with prescription for Norco.  He will be advised to keep the leg elevated above the heart and lay an ice pack over the [facility].  He was also strongly advised to avoid any attempt at weightbearing and will be given crutches.  He was advised to return to the emergency department for any symptom of concern if unable to see podiatry right away.  Medications  oxyCODONE (Oxy IR/ROXICODONE) immediate release  tablet 5 mg (5 mg Oral Given 04/08/18 1826)  meloxicam (MOBIC) tablet 15 mg (15 mg Oral Given 04/08/18 1826)    Pertinent labs & imaging results that were available during my care of the patient were reviewed by me and considered in my medical decision making (see chart for details).  _________________________________________   FINAL CLINICAL IMPRESSION(S) / ED DIAGNOSES  Final diagnoses:  Calcaneal fracture    ED Discharge Orders         Ordered    HYDROcodone-acetaminophen (NORCO/VICODIN) 5-325 MG tablet  Every 4 hours PRN     04/08/18 1910           If controlled substance prescribed during this visit, 12 month history viewed on the [facility] prior to issuing an initial prescription for Schedule II or III opiod.    Chinita Pester, FNP 04/08/18 Prentice Docker    Dionne Bucy, MD 04/08/18 2022

## 2018-04-08 NOTE — ED Notes (Signed)
Pt is waiting for his family to bring his crutches pt states he does not want to use ours

## 2020-10-26 ENCOUNTER — Encounter: Payer: Self-pay | Admitting: Intensive Care

## 2020-10-26 ENCOUNTER — Emergency Department
Admission: EM | Admit: 2020-10-26 | Discharge: 2020-10-26 | Disposition: A | Discharge status: Home / Self Care | Payer: Self-pay | Attending: Emergency Medicine | Admitting: Emergency Medicine

## 2020-10-26 ENCOUNTER — Other Ambulatory Visit: Payer: Self-pay

## 2020-10-26 ENCOUNTER — Emergency Department: Payer: Self-pay

## 2020-10-26 DIAGNOSIS — Z79899 Other long term (current) drug therapy: Secondary | ICD-10-CM | POA: Insufficient documentation

## 2020-10-26 DIAGNOSIS — F17210 Nicotine dependence, cigarettes, uncomplicated: Secondary | ICD-10-CM | POA: Insufficient documentation

## 2020-10-26 DIAGNOSIS — I10 Essential (primary) hypertension: Secondary | ICD-10-CM | POA: Insufficient documentation

## 2020-10-26 DIAGNOSIS — Z7982 Long term (current) use of aspirin: Secondary | ICD-10-CM | POA: Insufficient documentation

## 2020-10-26 DIAGNOSIS — G510 Bell's palsy: Secondary | ICD-10-CM | POA: Insufficient documentation

## 2020-10-26 LAB — CBC WITH DIFFERENTIAL/PLATELET
Abs Immature Granulocytes: 0.02 10*3/uL (ref 0.00–0.07)
Basophils Absolute: 0 10*3/uL (ref 0.0–0.1)
Basophils Relative: 0 %
Eosinophils Absolute: 0.1 10*3/uL (ref 0.0–0.5)
Eosinophils Relative: 1 %
HCT: 45 % (ref 39.0–52.0)
Hemoglobin: 16 g/dL (ref 13.0–17.0)
Immature Granulocytes: 0 %
Lymphocytes Relative: 20 %
Lymphs Abs: 1.6 10*3/uL (ref 0.7–4.0)
MCH: 34.5 pg — ABNORMAL HIGH (ref 26.0–34.0)
MCHC: 35.6 g/dL (ref 30.0–36.0)
MCV: 97 fL (ref 80.0–100.0)
Monocytes Absolute: 0.6 10*3/uL (ref 0.1–1.0)
Monocytes Relative: 7 %
Neutro Abs: 5.7 10*3/uL (ref 1.7–7.7)
Neutrophils Relative %: 72 %
Platelets: 197 10*3/uL (ref 150–400)
RBC: 4.64 MIL/uL (ref 4.22–5.81)
RDW: 12.6 % (ref 11.5–15.5)
WBC: 8.1 10*3/uL (ref 4.0–10.5)
nRBC: 0 % (ref 0.0–0.2)

## 2020-10-26 LAB — COMPREHENSIVE METABOLIC PANEL
ALT: 18 U/L (ref 0–44)
AST: 21 U/L (ref 15–41)
Albumin: 4.2 g/dL (ref 3.5–5.0)
Alkaline Phosphatase: 69 U/L (ref 38–126)
Anion gap: 10 (ref 5–15)
BUN: 15 mg/dL (ref 6–20)
CO2: 23 mmol/L (ref 22–32)
Calcium: 9.1 mg/dL (ref 8.9–10.3)
Chloride: 104 mmol/L (ref 98–111)
Creatinine, Ser: 0.79 mg/dL (ref 0.61–1.24)
GFR, Estimated: >60 mL/min (ref >60)
Glucose, Bld: 103 mg/dL — ABNORMAL HIGH (ref 70–99)
Potassium: 3.7 mmol/L (ref 3.5–5.1)
Sodium: 137 mmol/L (ref 135–145)
Total Bilirubin: 1 mg/dL (ref 0.3–1.2)
Total Protein: 7.6 g/dL (ref 6.5–8.1)

## 2020-10-26 MED ORDER — LISINOPRIL 10 MG PO TABS
20.0000 mg | ORAL_TABLET | Freq: Once | ORAL | Status: AC
  Administered 2020-10-26: 20 mg via ORAL
  Filled 2020-10-26 (×2): qty 2

## 2020-10-26 MED ORDER — PREDNISONE 20 MG PO TABS
60.0000 mg | ORAL_TABLET | Freq: Once | ORAL | Status: AC
  Administered 2020-10-26: 60 mg via ORAL
  Filled 2020-10-26: qty 3

## 2020-10-26 MED ORDER — LISINOPRIL 20 MG PO TABS: 20.0000 mg | ORAL_TABLET | Freq: Every day | ORAL | 0 refills | Status: DC

## 2020-10-26 MED ORDER — ACYCLOVIR 400 MG PO TABS: 400.0000 mg | ORAL_TABLET | Freq: Every day | ORAL | 0 refills | Status: AC

## 2020-10-26 MED ORDER — IOHEXOL 350 MG/ML SOLN
75.0000 mL | Freq: Once | INTRAVENOUS | Status: DC | PRN
  Filled 2020-10-26: qty 75

## 2020-10-26 MED ORDER — LORAZEPAM 2 MG/ML IJ SOLN
1.0000 mg | INTRAMUSCULAR | Status: DC | PRN
  Administered 2020-10-26: 1 mg via INTRAVENOUS
  Filled 2020-10-26: qty 1

## 2020-10-26 MED ORDER — PREDNISONE 20 MG PO TABS: 60.0000 mg | ORAL_TABLET | Freq: Every day | ORAL | 0 refills | Status: AC

## 2020-10-26 MED ORDER — ACYCLOVIR 200 MG PO CAPS
400.0000 mg | ORAL_CAPSULE | Freq: Once | ORAL | Status: AC
  Administered 2020-10-26: 400 mg via ORAL
  Filled 2020-10-26: qty 2

## 2020-10-26 NOTE — ED Notes (Signed)
Pt to ED c/o L facial droop that started at 0730 today. L face is "a little numb". Cannot close L eye. This has never happened before.

## 2020-10-26 NOTE — ED Notes (Signed)
Pt taken to MRI via stretcher.  

## 2020-10-26 NOTE — ED Notes (Signed)
Per Dr Scotty Court, no stroke labs at this time. Per [place], [place], due to pt BP, basic lab orders first.

## 2020-10-26 NOTE — ED Triage Notes (Signed)
Patient presents with left sided facial droop. Noticed by patient around 7:30am 10/26/2020. Denies numbness. No weakness noted. Speech clear.

## 2020-10-26 NOTE — ED Provider Notes (Signed)
[facility] Emergency Department Provider Note  ____________________________________________   Event Date/Time   First MD Initiated Contact with Patient 10/26/20 1603     (approximate)  I have reviewed the triage vital signs and the nursing notes.   HISTORY  Chief Complaint Facial Droop   HPI Dale Bentley is a 60 y.o. male who reports to the ER for evaluation of left-sided facial droop and mild numbness to the area.  He states he first noticed this around 730 this morning when he was eating breakfast at the table and noticed liquid rolling out of his mouth and he was unable to keep it in.  He also reports that this time he noticed that he could not completely close his left eye.  He denies any history of similar symptoms.  Denies a headache, blurred vision, dizziness, imbalance, weakness of any extremity.  He denies any history of blood clot, stroke, blood disorders.  He does have a significant history of hypertension, states that he has been out of his medicines "for a while".       Past Medical History:  Diagnosis Date  . Hypertension     There are no problems to display for this patient.   History reviewed. No pertinent surgical history.  Prior to Admission medications   Medication Sig Start Date End Date Taking? Authorizing Provider  acyclovir (ZOVIRAX) 400 MG tablet Take 1 tablet (400 mg total) by mouth 5 (five) times daily for 10 days. 10/26/20 11/05/20 Yes Nahjae Hoeg, Ruben Gottron, PA  lisinopril (ZESTRIL) 20 MG tablet Take 1 tablet (20 mg total) by mouth daily. 10/26/20 11/25/20 Yes Kaisy Severino, Ruben Gottron, PA  predniSONE (DELTASONE) 20 MG tablet Take 3 tablets (60 mg total) by mouth daily for 5 days. 10/26/20 10/31/20 Yes Lucy Chris, PA  aspirin EC 81 MG tablet Take 81 mg by mouth daily.    [provider]  ranitidine (ZANTAC) 150 MG capsule Take 1 capsule (150 mg total) by mouth 2 (two) times daily. 12/26/14   Sharman Cheek, MD  sucralfate  (CARAFATE) 1 G tablet Take 1 tablet (1 g total) by mouth 4 (four) times daily as needed. 12/26/14   Sharman Cheek, MD    Allergies Penicillins  History reviewed. No pertinent family history.  Social History Social History   Tobacco Use  . Smoking status: Current Every Day Smoker    Packs/day: 1.00    Types: Cigarettes  . Smokeless tobacco: Never Used  Substance Use Topics  . Alcohol use: Yes    Alcohol/week: 42.0 standard drinks    Types: 42 Cans of beer per week  . Drug use: Yes    Types: Cocaine    Review of Systems Constitutional: No fever/chills Eyes: No visual changes. ENT: No sore throat. Cardiovascular: Denies chest pain. Respiratory: Denies shortness of breath. Gastrointestinal: No abdominal pain.  No nausea, no vomiting.  No diarrhea.  No constipation. Genitourinary: Negative for dysuria. Musculoskeletal: Negative for back pain. Skin: Negative for rash. Neurological: + Left facial droop, negative for headaches   ____________________________________________   PHYSICAL EXAM:  VITAL SIGNS: ED Triage Vitals [10/26/20 1502]  Enc Vitals Group     BP (!) 203/114     Pulse Rate 86     Resp 18     Temp 97.7 F (36.5 C)     Temp Source Oral     SpO2 97 %     Weight 218 lb (98.9 kg)     Height 5\' 11"  (  1.803 m)     Head Circumference      Peak Flow      Pain Score 0     Pain Loc      Pain Edu?      Excl. in GC?    Constitutional: Alert and oriented. Well appearing and in no acute distress. Eyes: Conjunctivae are normal. PERRL. EOMI. patient is unable to completely close the left eye, right eye closes normally. Head: Atraumatic. Nose: Loss of nasolabial fold as described below.  No congestion/rhinnorhea. Mouth/Throat: Mucous membranes are moist.  Oropharynx non-erythematous. Neck: No stridor.  No tenderness to palpation of the midline or paraspinals of the cervical spine. Cardiovascular: Normal rate, regular rhythm. Grossly normal heart sounds.  Good  peripheral circulation. Respiratory: Normal respiratory effort.  No retractions. Lungs CTAB. Gastrointestinal: Soft and nontender. No distention. No abdominal bruits. No CVA tenderness. Musculoskeletal: 5/5 strength in the bilateral upper and lower extremities.  No lower extremity tenderness nor edema.  No joint effusions. Neurologic:  Normal speech and language.  The patient is unable to completely close the left eye, unable to raise the left eyebrow, unable to smile with the left facial musculature, there is loss of the nasolabial fold on the left.  Remainder of cranial nerve testing is within normal limits and deficits appear to be isolated to the facial nerve.  Neuromuscular testing of the upper and lower extremities is within normal limits.  No gait instability. Skin:  Skin is warm, dry and intact. No rash noted. Psychiatric: Mood and affect are normal. Speech and behavior are normal.  ____________________________________________   LABS (all labs ordered are listed, but only abnormal results are displayed)  Labs Reviewed  CBC WITH DIFFERENTIAL/PLATELET - Abnormal; Notable for the following components:      Result Value   MCH 34.5 (*)    All other components within normal limits  COMPREHENSIVE METABOLIC PANEL - Abnormal; Notable for the following components:   Glucose, Bld 103 (*)    All other components within normal limits    ____________________________________________  RADIOLOGY  MRI reported by radiology with no acute findings ____________________________________________   INITIAL IMPRESSION / ASSESSMENT AND PLAN / ED COURSE  As part of my medical decision making, I reviewed the following data within the electronic MEDICAL RECORD NUMBER Nursing notes reviewed and incorporated, Labs reviewed, Evaluated by EM attending Dr. Larinda Buttery and Notes from prior ED visits        Patient is a 60 year old male who presents to the emergency department for evaluation of left-sided facial  droop that he noticed early this morning while eating breakfast.  He declines history of similar, denies any weakness in the extremities.  In triage, patient was quite hypertensive with a pressure of 203/114.  Remainder of vitals was within normal limits.  He does have a noted history of hypertension, states he used to be on lisinopril 20 mg but has been out of this for "a while".  He does endorse wishing to be initiated on this medication again.  In terms of neurologic testing, the patient's extremities are all functioning appropriately without any noted weakness.  He does have noted left-sided facial weakness with difficulty closing left eye, unable to raise the left eyebrow, loss of the nasolabial fold on the left as well as inability to smile with the left side facial musculature.  Remainder of cranial nerve exam is within normal limits.  Differentials include Bell's palsy, ischemic stroke, hemorrhagic stroke, peripheral nerve mass  Given the patient's  hypertension, basic labs were initiated from triage with a CBC and CMP that are grossly unremarkable.  MRI was obtained to confirm no presence of stroke and this was negative.  Initiated treatment for Bell's palsy with steroid, antivirals.  I also initiated restarting the patient's lisinopril as I feel this is appropriate to control his blood pressure.  I will refer him to a local primary care where he can establish as he states he does not have one available to him.  Dr. Larinda Buttery also personally evaluated the patient and agreed with the patient's work-up and discharge disposition.  Return precautions were discussed with the patient and he stable this time for outpatient management.      ____________________________________________   FINAL CLINICAL IMPRESSION(S) / ED DIAGNOSES  Final diagnoses:  Left-sided Bell's palsy     ED Discharge Orders         Ordered    acyclovir (ZOVIRAX) 400 MG tablet  5 times daily        10/26/20 1910    predniSONE  (DELTASONE) 20 MG tablet  Daily        10/26/20 1910    lisinopril (ZESTRIL) 20 MG tablet  Daily        10/26/20 1910          *Please note:  Dale Bentley was evaluated in Emergency Department on 10/27/2020 for the symptoms described in the history of present illness. He was evaluated in the context of the global COVID-19 pandemic, which necessitated consideration that the patient might be at risk for infection with the SARS-CoV-2 virus that causes COVID-19. Institutional protocols and algorithms that pertain to the evaluation of patients at risk for COVID-19 are in a state of rapid change based on information released by regulatory bodies including the CDC and federal and state organizations. These policies and algorithms were followed during the patient's care in the ED.  Some ED evaluations and interventions may be delayed as a result of limited staffing during and the pandemic.*   Note:  This document was prepared using Dragon voice recognition software and may include unintentional dictation errors.   Lucy Chris, PA 10/27/20 2320    Chesley Noon, MD 10/29/20 [phone]

## 2020-10-26 NOTE — Discharge Instructions (Signed)
Please tape your eye closed for sleeping. During the waking hours, use Artificial Tears eye drops for comfort and to prevent eye injury. Take the other medications as prescribed. Follow up with primary care as referred above. Return to ER for any worsening.

## 2020-12-05 ENCOUNTER — Ambulatory Visit
Admission: EM | Admit: 2020-12-05 | Discharge: 2020-12-05 | Disposition: A | Discharge status: Home / Self Care | Payer: Self-pay | Attending: Internal Medicine | Admitting: Internal Medicine

## 2020-12-05 ENCOUNTER — Other Ambulatory Visit: Payer: Self-pay

## 2020-12-05 ENCOUNTER — Encounter: Payer: Self-pay | Admitting: Emergency Medicine

## 2020-12-05 DIAGNOSIS — H6123 Impacted cerumen, bilateral: Secondary | ICD-10-CM

## 2020-12-05 DIAGNOSIS — I10 Essential (primary) hypertension: Secondary | ICD-10-CM

## 2020-12-05 HISTORY — DX: Bell's palsy: G51.0

## 2020-12-05 LAB — POCT URINALYSIS DIP (MANUAL ENTRY)
Bilirubin, UA: NEGATIVE
Glucose, UA: NEGATIVE mg/dL
Ketones, POC UA: NEGATIVE mg/dL
Leukocytes, UA: NEGATIVE
Nitrite, UA: NEGATIVE
Protein Ur, POC: NEGATIVE mg/dL
Spec Grav, UA: >=1.03 — AB (ref 1.010–1.025)
Urobilinogen, UA: 0.2 E.U./dL
pH, UA: 5.5 (ref 5.0–8.0)

## 2020-12-05 MED ORDER — LISINOPRIL 10 MG PO TABS: ORAL_TABLET | ORAL | 0 refills | Status: DC

## 2020-12-05 MED ORDER — LISINOPRIL 10 MG PO TABS: 10.0000 mg | ORAL_TABLET | Freq: Every day | ORAL | 0 refills | Status: DC

## 2020-12-05 NOTE — Discharge Instructions (Addendum)
Call Dr Juel Burrow today and they will find you an appointment this month Take 3 lisinoprils at a time every morning Decrease your beer to only 2 per day max. Your urine shows microscopic blood, so please make sure this is checked again when you see your new provider.  Record your blood pressures 2-3 hours after you took your medication and take it to your new doctor.

## 2020-12-05 NOTE — ED Triage Notes (Signed)
Pt presents today with c/o of "having high blood pressure". He reports running out of his Lisinipril and does not have PCP. He has run out of Lisinipril 4 days.

## 2020-12-05 NOTE — ED Notes (Signed)
bilateral

## 2020-12-05 NOTE — ED Provider Notes (Signed)
[facility]    CSN: [id number] Arrival date & time: 12/05/20  0845      History   Chief Complaint Chief Complaint  Patient presents with   Hypertension    HPI Dale Bentley is a 60 y.o. male who presents due to running out of his lisinopril one week ago. Was seen in ER for Bell's Palsy and had HTN then, and was started on Lisinopril which he had been on in the past at the same dose. Since on it has felt more energetic and has had less HA's. While on Lisinopril 20 mg qd, his BP has been around 158/85. Today while working he bent over and felt light headed when he rose back up. Has mild pressure on his forehead, but denies chest pains, SOB, leg swelling. 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 Has never had lipids or HGB1C  done. He was supposed to get established with a PCP last month ,but he never called to make an apt. He went tere today and was told will take 2-3 months to get in and he did not make an apt.     Past Medical History:  Diagnosis Date   Bell's palsy    Hypertension     There are no problems to display for this patient.   History reviewed. No pertinent surgical history.     Home Medications    Prior to Admission medications   Medication Sig Start Date End Date Taking? Authorizing Provider  lisinopril (ZESTRIL) 10 MG tablet 3 qd in the am's 12/05/20   Rodriguez-Southworth, Nettie Elm, PA-C    Family History History reviewed. No pertinent family history.  Social History Social History   Tobacco Use   Smoking status: Every Day    Packs/day: 1.00    Pack years: 0.00    Types: Cigarettes   Smokeless tobacco: Never  Vaping Use   Vaping Use: Never used  Substance Use Topics   Alcohol use: Yes    Alcohol/week: 6.0 standard drinks    Types: 6 Cans of beer per week     Allergies   Penicillins   Review of Systems  Constitutional: Negative for diaphoresis and unexpected weight change.  HENT: Negative for tinnitus. R  ear feels stuffy  Eyes: Negative for visual disturbance.  Respiratory: Negative for chest tightness and shortness of breath.   Cardiovascular: Negative for chest pain, palpitations and leg swelling.  Gastrointestinal: Negative for constipation, diarrhea and nausea.  Endocrine: Negative for polydipsia, polyphagia and polyuria.  Genitourinary: Negative for dysuria and frequency.  Skin: Negative for rash and wound.  Neurological: Negative speech difficulty, weakness, numbness and positive for headaches and dizziness.    Physical Exam Triage Vital Signs ED Triage Vitals  Enc Vitals Group     BP 12/05/20 0856 (!) 194/108     Pulse Rate 12/05/20 0856 83     Resp 12/05/20 0856 18     Temp 12/05/20 0856 97.8 F (36.6 C)     Temp Source 12/05/20 0856 Oral     SpO2 12/05/20 0856 96 %     Weight --      Height --      Head Circumference --      Peak Flow --      Pain Score 12/05/20 0858 0     Pain Loc --      Pain Edu? --      Excl. in GC? --  No data found.  Updated Vital Signs BP (!) 172/94   Pulse 82   Temp 97.8 F (36.6 C) (Oral)   Resp 18   SpO2 96%   Visual Acuity Right Eye Distance:   Left Eye Distance:   Bilateral Distance:    Right Eye Near:   Left Eye Near:    Bilateral Near:     Physical Exam  Constitutional: he is oriented to person, place, and time. He appears well-developed and well-nourished. No distress.  HENT:  Head: Normocephalic and atraumatic.  Right Ear: canal with wax, and not fully removed after lavage. Left Ear: canal with cerumen, but TM is visible.  Nose: Nose normal.  Eyes: Conjunctivae are normal. Right eye exhibits no discharge. Left eye exhibits no discharge. No scleral icterus.  Neck: Neck supple. No thyromegaly present.  No carotid bruits bilaterally  Cardiovascular: Normal rate and regular rhythm.  No murmur heard. Pulmonary/Chest: Effort normal and breath sounds normal. No respiratory distress.  Musculoskeletal: Normal range of  motion. he exhibits no edema.  Lymphadenopathy: he has no cervical adenopathy.  Neurological: he is alert and oriented to person, place, and time.  Skin: Skin is warm and dry. Capillary refill takes less than 2 seconds. No rash noted. he is not diaphoretic.  Psychiatric: he has a normal mood and affect. His behavior is normal. Judgment and thought content normal.  Nursing note reviewed.   UC Treatments / Results  Labs (all labs ordered are listed, but only abnormal results are displayed) Labs Reviewed  POCT URINALYSIS DIP (MANUAL ENTRY) - Abnormal; Notable for the following components:      Result Value   Spec Grav, UA >=1.030 (*)    Blood, UA small (*)    All other components within normal limits    EKG   Radiology No results found.  Procedures Procedures (including critical care time)  Medications Ordered in UC Medications - No data to display  Initial Impression / Assessment and Plan / UC Course  I have reviewed the triage vital signs and the nursing notes. Pertinent labs  results that were available during my care of the patient were reviewed by me and considered in my medical decision making (see chart for details). Has HTN and cerumen impaction of both ears. Lavage was done and could hear better.  I placed him on Lisinopril 30 mg qd. I spoke with Dr Fredna Dow office and they can see him in a few week, so pt was given their information to call them today.  Advised to decrease his drinking to max 2 beers a day and d/c smoking Needs to have UA repeated due to mild microscopic hematuria when he sees new provider and will needs lipids and HGBA1C since his glucose have been elevated since 2016   Final Clinical Impressions(s) / UC Diagnoses   Final diagnoses:  Essential hypertension     Discharge Instructions      Call Dr Juel Burrow today and they will find you an appointment this month Take 3 lisinoprils at a time every morning Decrease your beer to only 2 per day  max. Your urine shows microscopic blood, so please make sure this is checked again when you see your new provider.  Record your blood pressures 2-3 hours after you took your medication and take it to your new doctor.      ED Prescriptions     Medication Sig Dispense Auth. Provider   lisinopril (ZESTRIL) 10 MG tablet  (Status: Discontinued) Take 1 tablet (  10 mg total) by mouth daily. 90 tablet Rodriguez-Southworth, Mavis Fichera, PA-C   lisinopril (ZESTRIL) 10 MG tablet 3 qd in the am's 90 tablet Rodriguez-Southworth, Nettie Elm, PA-C      PDMP not reviewed this encounter.   Garey Ham, [place] 12/05/20 [phone]

## 2020-12-16 ENCOUNTER — Ambulatory Visit (INDEPENDENT_AMBULATORY_CARE_PROVIDER_SITE_OTHER): Payer: Self-pay | Admitting: Internal Medicine

## 2020-12-16 ENCOUNTER — Other Ambulatory Visit: Payer: Self-pay

## 2020-12-16 ENCOUNTER — Encounter: Payer: Self-pay | Admitting: Internal Medicine

## 2020-12-16 VITALS — BP 187/92 | HR 80 | Ht 72.0 in | Wt 202.0 lb

## 2020-12-16 DIAGNOSIS — F17200 Nicotine dependence, unspecified, uncomplicated: Secondary | ICD-10-CM

## 2020-12-16 DIAGNOSIS — I10 Essential (primary) hypertension: Secondary | ICD-10-CM

## 2020-12-16 DIAGNOSIS — L03114 Cellulitis of left upper limb: Secondary | ICD-10-CM

## 2020-12-16 DIAGNOSIS — Z7141 Alcohol abuse counseling and surveillance of alcoholic: Secondary | ICD-10-CM

## 2020-12-16 MED ORDER — AMLODIPINE BESYLATE 5 MG PO TABS: 5.0000 mg | ORAL_TABLET | Freq: Every day | ORAL | 3 refills | Status: DC

## 2020-12-16 MED ORDER — DOXYCYCLINE HYCLATE 100 MG PO TABS: 100.0000 mg | ORAL_TABLET | Freq: Two times a day (BID) | ORAL | 0 refills | Status: DC

## 2020-12-16 NOTE — Assessment & Plan Note (Signed)

## 2020-12-16 NOTE — Assessment & Plan Note (Signed)
Patient was advised to stop drinking 

## 2020-12-16 NOTE — Assessment & Plan Note (Signed)
Patient was started on doxycycline. 

## 2020-12-16 NOTE — Assessment & Plan Note (Signed)
-   I instructed the patient to stop smoking and provided them with smoking cessation materials.  - I informed the patient that smoking puts them at increased risk for cancer, COPD, hypertension, and more.  - Informed the patient to seek help if they begin to have trouble breathing, develop chest pain, start to cough up blood, feel faint, or pass out.  

## 2020-12-16 NOTE — Progress Notes (Signed)
New Patient Office Visit  Subjective:  Patient ID: Dale Bentley, male    DOB: 10/14/1960  Age: 60 y.o. MRN: [id number]  CC:  Chief Complaint  Patient presents with   New Patient (Initial Visit)    HPI Patient presents for patient has exacerbation of the left forearm on the dorsal aspect.  It was due to the knee injury.  He has been dressing it with Neosporin and hydrogen peroxide.  He has received a tetanus shot.  He drinks about 4-5 beers a day.  Smoke 1 pack/day.40 yrs Past Medical History:  Diagnosis Date   Bell's palsy    Hypertension      Current Outpatient Medications:    amLODipine (NORVASC) 5 MG tablet, Take 1 tablet (5 mg total) by mouth daily., Disp: 90 tablet, Rfl: 3   doxycycline (VIBRA-TABS) 100 MG tablet, Take 1 tablet (100 mg total) by mouth 2 (two) times daily., Disp: 20 tablet, Rfl: 0   lisinopril (ZESTRIL) 10 MG tablet, 3 qd in the am's (Patient taking differently: Take 30 mg by mouth daily.), Disp: 90 tablet, Rfl: 0   History reviewed. No pertinent surgical history.  Family History  Problem Relation Age of Onset   Hypertension Father     Social History   Socioeconomic History   Marital status: Married    Spouse name: Not on file   Number of children: Not on file   Years of education: Not on file   Highest education level: Not on file  Occupational History   Not on file  Tobacco Use   Smoking status: Every Day    Packs/day: 1.00    Years: 40.00    Pack years: 40.00    Types: Cigarettes   Smokeless tobacco: Never  Vaping Use   Vaping Use: Never used  Substance and Sexual Activity   Alcohol use: Yes    Alcohol/week: 6.0 standard drinks    Types: 6 Cans of beer per week   Drug use: Never   Sexual activity: Not Currently  Other Topics Concern   Not on file  Social History Narrative   Not on file   Social Determinants of Health   Financial Resource Strain: Not on file  Food Insecurity: Not on file  Transportation Needs: Not on file   Physical Activity: Not on file  Stress: Not on file  Social Connections: Not on file  Intimate Partner Violence: Not on file    ROS Review of Systems  Constitutional: Negative.   HENT: Negative.    Eyes: Negative.   Respiratory:  Positive for apnea and wheezing. Negative for shortness of breath and stridor.   Cardiovascular: Negative.  Negative for chest pain, palpitations and leg swelling.  Gastrointestinal: Negative.  Negative for blood in stool.  Endocrine: Negative.   Genitourinary: Negative.  Negative for frequency and scrotal swelling.  Musculoskeletal: Negative.   Skin: Negative.   Allergic/Immunologic: Negative.   Neurological: Negative.  Negative for syncope and speech difficulty.  Hematological: Negative.   Psychiatric/Behavioral: Negative.  Negative for behavioral problems and confusion.   All other systems reviewed and are negative.  Objective:   Today's Vitals: BP (!) 187/92   Pulse 80   Ht 6' (1.829 m)   Wt 202 lb (91.6 kg)   BMI 27.40 kg/m   Physical Exam  Assessment & Plan:   Problem List Items Addressed This Visit       Cardiovascular and Mediastinum   Primary hypertension - Primary  Patient denies any chest pain or shortness of breath there is no history of palpitation or paroxysmal nocturnal dyspnea   patient was advised to follow low-salt low-cholesterol diet    ideally I want to keep systolic blood pressure below 628 mmHg, patient was asked to check blood pressure one times a week and give me a report on that.  Patient will be follow-up in 3 months  or earlier as needed, patient will call me back for any change in the cardiovascular symptoms Patient was advised to buy a book from local bookstore concerning blood pressure and read several chapters  every day.  This will be supplemented by some of the material we will give him from the office.  Patient should also utilize other resources like YouTube and Internet to learn more about the blood  pressure and the diet.       Relevant Medications   amLODipine (NORVASC) 5 MG tablet   doxycycline (VIBRA-TABS) 100 MG tablet     Other   Cellulitis of left upper extremity    Patient was started on doxycycline       Relevant Medications   doxycycline (VIBRA-TABS) 100 MG tablet   High dependence on smoking    - I instructed the patient to stop smoking and provided them with smoking cessation materials.  - I informed the patient that smoking puts them at increased risk for cancer, COPD, hypertension, and more.  - Informed the patient to seek help if they begin to have trouble breathing, develop chest pain, start to cough up blood, feel faint, or pass out.       Alcohol abuse counseling and surveillance    Patient was advised to stop drinking.        Outpatient Encounter Medications as of 12/16/2020  Medication Sig   amLODipine (NORVASC) 5 MG tablet Take 1 tablet (5 mg total) by mouth daily.   doxycycline (VIBRA-TABS) 100 MG tablet Take 1 tablet (100 mg total) by mouth 2 (two) times daily.   lisinopril (ZESTRIL) 10 MG tablet 3 qd in the am's (Patient taking differently: Take 30 mg by mouth daily.)   No facility-administered encounter medications on file as of 12/16/2020.    Follow-up: No follow-ups on file.   Corky Downs, MD

## 2020-12-30 ENCOUNTER — Other Ambulatory Visit: Payer: Self-pay

## 2020-12-30 ENCOUNTER — Encounter: Payer: Self-pay | Admitting: Internal Medicine

## 2020-12-30 ENCOUNTER — Ambulatory Visit (INDEPENDENT_AMBULATORY_CARE_PROVIDER_SITE_OTHER): Payer: Self-pay | Admitting: Internal Medicine

## 2020-12-30 VITALS — BP 156/87 | HR 82 | Ht 72.0 in | Wt 202.4 lb

## 2020-12-30 DIAGNOSIS — F17200 Nicotine dependence, unspecified, uncomplicated: Secondary | ICD-10-CM

## 2020-12-30 DIAGNOSIS — I10 Essential (primary) hypertension: Secondary | ICD-10-CM

## 2020-12-30 DIAGNOSIS — L03114 Cellulitis of left upper limb: Secondary | ICD-10-CM

## 2020-12-30 DIAGNOSIS — Z7141 Alcohol abuse counseling and surveillance of alcoholic: Secondary | ICD-10-CM

## 2020-12-30 MED ORDER — LISINOPRIL 10 MG PO TABS: ORAL_TABLET | ORAL | 0 refills | Status: DC

## 2020-12-30 NOTE — Assessment & Plan Note (Signed)
Left forearm is healing well

## 2020-12-30 NOTE — Assessment & Plan Note (Signed)
-   I instructed the patient to stop smoking and provided them with smoking cessation materials.  - I informed the patient that smoking puts them at increased risk for cancer, COPD, hypertension, and more.  - Informed the patient to seek help if they begin to have trouble breathing, develop chest pain, start to cough up blood, feel faint, or pass out.  Will start patient on Chantix

## 2020-12-30 NOTE — Assessment & Plan Note (Signed)

## 2020-12-30 NOTE — Progress Notes (Signed)
Established Patient Office Visit  Subjective:  Patient ID: Dale Bentley, male    DOB: 1961/03/12  Age: 60 y.o. MRN: [id number]  CC:  Chief Complaint  Patient presents with   Hypertension    Hypertension   [name] presents for bp check, left upper extremity is healing up good the laceration is following a good scab there is no fever or chills, cellulitis is resolved.  Is trying to cut down on the smoking.  He is also cutting down on drinking alcohol.  Blood pressure has come down remarkably since his previous visit.  Taking his medicine on a regular basis  Past Medical History:  Diagnosis Date   Bell's palsy    Hypertension     History reviewed. No pertinent surgical history.  Family History  Problem Relation Age of Onset   Hypertension Father     Social History   Socioeconomic History   Marital status: Married    Spouse name: Not on file   Number of children: Not on file   Years of education: Not on file   Highest education level: Not on file  Occupational History   Not on file  Tobacco Use   Smoking status: Every Day    Packs/day: 1.00    Years: 40.00    Pack years: 40.00    Types: Cigarettes   Smokeless tobacco: Never  Vaping Use   Vaping Use: Never used  Substance and Sexual Activity   Alcohol use: Yes    Alcohol/week: 6.0 standard drinks    Types: 6 Cans of beer per week   Drug use: Never   Sexual activity: Not Currently  Other Topics Concern   Not on file  Social History Narrative   Not on file   Social Determinants of Health   Financial Resource Strain: Not on file  Food Insecurity: Not on file  Transportation Needs: Not on file  Physical Activity: Not on file  Stress: Not on file  Social Connections: Not on file  Intimate Partner Violence: Not on file     Current Outpatient Medications:    amLODipine (NORVASC) 5 MG tablet, Take 1 tablet (5 mg total) by mouth daily., Disp: 90 tablet, Rfl: 3   doxycycline (VIBRA-TABS) 100 MG  tablet, Take 1 tablet (100 mg total) by mouth 2 (two) times daily., Disp: 20 tablet, Rfl: 0   lisinopril (ZESTRIL) 10 MG tablet, 3 qd in the am's, Disp: 90 tablet, Rfl: 0   Allergies  Allergen Reactions   Penicillins Hives and Swelling    ROS Review of Systems  Constitutional: Negative.   HENT: Negative.    Eyes: Negative.   Respiratory: Negative.    Cardiovascular: Negative.   Gastrointestinal: Negative.   Endocrine: Negative.   Genitourinary: Negative.   Musculoskeletal: Negative.   Skin: Negative.   Allergic/Immunologic: Negative.   Neurological: Negative.   Hematological: Negative.   Psychiatric/Behavioral: Negative.    All other systems reviewed and are negative.    Objective:    Physical Exam Vitals reviewed.  Constitutional:      Appearance: Normal appearance.  HENT:     Mouth/Throat:     Mouth: Mucous membranes are moist.  Eyes:     Pupils: Pupils are equal, round, and reactive to light.  Neck:     Vascular: No carotid bruit.  Cardiovascular:     Rate and Rhythm: Normal rate and regular rhythm.     Pulses: Normal pulses.     Heart sounds: Normal  heart sounds.  Pulmonary:     Effort: Pulmonary effort is normal.     Breath sounds: Normal breath sounds.  Abdominal:     General: Bowel sounds are normal.     Palpations: Abdomen is soft. There is no hepatomegaly, splenomegaly or mass.     Tenderness: There is no abdominal tenderness.     Hernia: No hernia is present.  Musculoskeletal:     Cervical back: Neck supple.     Right lower leg: No edema.     Left lower leg: No edema.  Skin:    Findings: No rash.  Neurological:     Mental Status: He is alert and oriented to person, place, and time.     Motor: No weakness.  Psychiatric:        Mood and Affect: Mood normal.        Behavior: Behavior normal.    BP (!) 156/87   Pulse 82   Ht 6' (1.829 m)   Wt 202 lb 6.4 oz (91.8 kg)   BMI 27.45 kg/m  Wt Readings from Last 3 Encounters:  12/30/20 202 lb  6.4 oz (91.8 kg)  12/16/20 202 lb (91.6 kg)  10/26/20 218 lb (98.9 kg)     Health Maintenance Due  Topic Date Due   COVID-19 Vaccine (1) Never done   Pneumococcal Vaccine 40-40 Years old (1 - PCV) Never done   HIV Screening  Never done   Hepatitis C Screening  Never done   TETANUS/TDAP  Never done   COLONOSCOPY (Pts 45-57yrs Insurance coverage will need to be confirmed)  Never done   Zoster Vaccines- Shingrix (1 of 2) Never done    There are no preventive care reminders to display for this patient.  No results found for: TSH Lab Results  Component Value Date   WBC 8.1 10/26/2020   HGB 16.0 10/26/2020   HCT 45.0 10/26/2020   MCV 97.0 10/26/2020   PLT 197 10/26/2020   Lab Results  Component Value Date   NA 137 10/26/2020   K 3.7 10/26/2020   CO2 23 10/26/2020   GLUCOSE 103 (H) 10/26/2020   BUN 15 10/26/2020   CREATININE 0.79 10/26/2020   BILITOT 1.0 10/26/2020   ALKPHOS 69 10/26/2020   AST 21 10/26/2020   ALT 18 10/26/2020   PROT 7.6 10/26/2020   ALBUMIN 4.2 10/26/2020   CALCIUM 9.1 10/26/2020   ANIONGAP 10 10/26/2020   No results found for: CHOL No results found for: HDL No results found for: LDLCALC No results found for: TRIG No results found for: CHOLHDL No results found for: JQZE0P    Assessment & Plan:   Problem List Items Addressed This Visit       Cardiovascular and Mediastinum   Primary hypertension - Primary     Patient denies any chest pain or shortness of breath there is no history of palpitation or paroxysmal nocturnal dyspnea   patient was advised to follow low-salt low-cholesterol diet    ideally I want to keep systolic blood pressure below 233 mmHg, patient was asked to check blood pressure one times a week and give me a report on that.  Patient will be follow-up in 3 months  or earlier as needed, patient will call me back for any change in the cardiovascular symptoms Patient was advised to buy a book from local bookstore concerning blood  pressure and read several chapters  every day.  This will be supplemented by some of the material we will  give him from the office.  Patient should also utilize other resources like YouTube and Internet to learn more about the blood pressure and the diet.       Relevant Medications   lisinopril (ZESTRIL) 10 MG tablet     Other   Cellulitis of left upper extremity    Left forearm is healing well       High dependence on smoking    - I instructed the patient to stop smoking and provided them with smoking cessation materials.  - I informed the patient that smoking puts them at increased risk for cancer, COPD, hypertension, and more.  - Informed the patient to seek help if they begin to have trouble breathing, develop chest pain, start to cough up blood, feel faint, or pass out.  Will start patient on Chantix       Alcohol abuse counseling and surveillance    He says that he has reduced drinking but has not stopped yet        Meds ordered this encounter  Medications   lisinopril (ZESTRIL) 10 MG tablet    Sig: 3 qd in the am's    Dispense:  90 tablet    Refill:  0    Please correct this rx  and cancel the one I sent for one qd    Follow-up: No follow-ups on file.    Corky Downs, MD

## 2020-12-30 NOTE — Assessment & Plan Note (Signed)
He says that he has reduced drinking but has not stopped yet

## 2021-01-29 ENCOUNTER — Ambulatory Visit (INDEPENDENT_AMBULATORY_CARE_PROVIDER_SITE_OTHER): Payer: Self-pay | Admitting: Internal Medicine

## 2021-01-29 ENCOUNTER — Other Ambulatory Visit: Payer: Self-pay

## 2021-01-29 ENCOUNTER — Encounter: Payer: Self-pay | Admitting: Internal Medicine

## 2021-01-29 VITALS — BP 155/97 | HR 87 | Ht 72.0 in | Wt 199.6 lb

## 2021-01-29 DIAGNOSIS — F17200 Nicotine dependence, unspecified, uncomplicated: Secondary | ICD-10-CM

## 2021-01-29 DIAGNOSIS — I10 Essential (primary) hypertension: Secondary | ICD-10-CM

## 2021-01-29 DIAGNOSIS — R1310 Dysphagia, unspecified: Secondary | ICD-10-CM

## 2021-01-29 DIAGNOSIS — Z7141 Alcohol abuse counseling and surveillance of alcoholic: Secondary | ICD-10-CM

## 2021-01-29 MED ORDER — LISINOPRIL 10 MG PO TABS: ORAL_TABLET | ORAL | 3 refills | Status: DC

## 2021-01-29 NOTE — Addendum Note (Signed)
Addended by: Jobie Quaker on: 01/29/2021 11:28 AM   Modules accepted: Orders

## 2021-01-29 NOTE — Assessment & Plan Note (Signed)
Patient was advised to stop drinking alcohol 

## 2021-01-29 NOTE — Assessment & Plan Note (Signed)

## 2021-01-29 NOTE — Assessment & Plan Note (Signed)
-   I instructed the patient to stop smoking and provided them with smoking cessation materials.  - I informed the patient that smoking puts them at increased risk for cancer, COPD, hypertension, and more.  - Informed the patient to seek help if they begin to have trouble breathing, develop chest pain, start to cough up blood, feel faint, or pass out.  

## 2021-01-29 NOTE — Progress Notes (Signed)
Established Patient Office Visit  Subjective:  Patient ID: Dale Bentley, male    DOB: Dec 30, 1960  Age: 60 y.o. MRN: [id number]  CC:  Chief Complaint  Patient presents with   Hypertension    1 month BP follow up    Hypertension   Dale Bentley presents for evaluation of dysphagia dysosmia.  On her throat last week.  He also complains of epigastric burning, his left chest were normal, he denies any history of chest pain on exertion patient is a smoker drinks mild to moderate.  Past Medical History:  Diagnosis Date   Bell's palsy    Hypertension     History reviewed. No pertinent surgical history.  Family History  Problem Relation Age of Onset   Hypertension Father     Social History   Socioeconomic History   Marital status: Married    Spouse name: Not on file   Number of children: Not on file   Years of education: Not on file   Highest education level: Not on file  Occupational History   Not on file  Tobacco Use   Smoking status: Every Day    Packs/day: 1.00    Years: 40.00    Pack years: 40.00    Types: Cigarettes   Smokeless tobacco: Never  Vaping Use   Vaping Use: Never used  Substance and Sexual Activity   Alcohol use: Yes    Alcohol/week: 6.0 standard drinks    Types: 6 Cans of beer per week   Drug use: Never   Sexual activity: Not Currently  Other Topics Concern   Not on file  Social History Narrative   Not on file   Social Determinants of Health   Financial Resource Strain: Not on file  Food Insecurity: Not on file  Transportation Needs: Not on file  Physical Activity: Not on file  Stress: Not on file  Social Connections: Not on file  Intimate Partner Violence: Not on file     Current Outpatient Medications:    amLODipine (NORVASC) 5 MG tablet, Take 1 tablet (5 mg total) by mouth daily., Disp: 90 tablet, Rfl: 3   doxycycline (VIBRA-TABS) 100 MG tablet, Take 1 tablet (100 mg total) by mouth 2 (two) times daily., Disp: 20 tablet,  Rfl: 0   lisinopril (ZESTRIL) 10 MG tablet, 3 qd in the am's, Disp: 90 tablet, Rfl: 3   Allergies  Allergen Reactions   Penicillins Hives and Swelling    ROS Review of Systems  Constitutional: Negative.   HENT: Negative.    Eyes: Negative.   Respiratory: Negative.    Cardiovascular: Negative.   Gastrointestinal: Negative.   Endocrine: Negative.   Genitourinary: Negative.   Musculoskeletal: Negative.   Skin: Negative.   Allergic/Immunologic: Negative.   Neurological: Negative.   Hematological: Negative.   Psychiatric/Behavioral: Negative.    All other systems reviewed and are negative.    Objective:    Physical Exam Vitals reviewed.  Constitutional:      Appearance: Normal appearance.  HENT:     Mouth/Throat:     Mouth: Mucous membranes are moist.  Eyes:     Pupils: Pupils are equal, round, and reactive to light.  Neck:     Vascular: No carotid bruit.  Cardiovascular:     Rate and Rhythm: Normal rate and regular rhythm.     Pulses: Normal pulses.     Heart sounds: Normal heart sounds.  Pulmonary:     Effort: Pulmonary effort is normal.  Breath sounds: Normal breath sounds.  Abdominal:     General: Bowel sounds are normal.     Palpations: Abdomen is soft. There is no hepatomegaly, splenomegaly or mass.     Tenderness: There is no abdominal tenderness.     Hernia: No hernia is present.  Musculoskeletal:     Cervical back: Neck supple.     Right lower leg: No edema.     Left lower leg: No edema.  Skin:    Findings: No rash.  Neurological:     Mental Status: He is alert and oriented to person, place, and time.     Motor: No weakness.  Psychiatric:        Mood and Affect: Mood normal.        Behavior: Behavior normal.    BP (!) 155/97   Pulse 87   Ht 6' (1.829 m)   Wt 199 lb 9.6 oz (90.5 kg)   BMI 27.07 kg/m  Wt Readings from Last 3 Encounters:  01/29/21 199 lb 9.6 oz (90.5 kg)  12/30/20 202 lb 6.4 oz (91.8 kg)  12/16/20 202 lb (91.6 kg)      Health Maintenance Due  Topic Date Due   COVID-19 Vaccine (1) Never done   Pneumococcal Vaccine 4-8 Years old (1 - PCV) Never done   HIV Screening  Never done   Hepatitis C Screening  Never done   TETANUS/TDAP  Never done   COLONOSCOPY (Pts 45-68yrs Insurance coverage will need to be confirmed)  Never done   Zoster Vaccines- Shingrix (1 of 2) Never done   INFLUENZA VACCINE  01/20/2021    There are no preventive care reminders to display for this patient.  No results found for: TSH Lab Results  Component Value Date   WBC 8.1 10/26/2020   HGB 16.0 10/26/2020   HCT 45.0 10/26/2020   MCV 97.0 10/26/2020   PLT 197 10/26/2020   Lab Results  Component Value Date   NA 137 10/26/2020   K 3.7 10/26/2020   CO2 23 10/26/2020   GLUCOSE 103 (H) 10/26/2020   BUN 15 10/26/2020   CREATININE 0.79 10/26/2020   BILITOT 1.0 10/26/2020   ALKPHOS 69 10/26/2020   AST 21 10/26/2020   ALT 18 10/26/2020   PROT 7.6 10/26/2020   ALBUMIN 4.2 10/26/2020   CALCIUM 9.1 10/26/2020   ANIONGAP 10 10/26/2020   No results found for: CHOL No results found for: HDL No results found for: LDLCALC No results found for: TRIG No results found for: CHOLHDL No results found for: OZHY8M    Assessment & Plan:   Problem List Items Addressed This Visit       Cardiovascular and Mediastinum   Primary hypertension - Primary     Patient denies any chest pain or shortness of breath there is no history of palpitation or paroxysmal nocturnal dyspnea   patient was advised to follow low-salt low-cholesterol diet    ideally I want to keep systolic blood pressure below 578 mmHg, patient was asked to check blood pressure one times a week and give me a report on that.  Patient will be follow-up in 3 months  or earlier as needed, patient will call me back for any change in the cardiovascular symptoms Patient was advised to buy a book from local bookstore concerning blood pressure and read several chapters  every  day.  This will be supplemented by some of the material we will give him from the office.  Patient should also  utilize other resources like YouTube and Internet to learn more about the blood pressure and the diet.       Relevant Medications   lisinopril (ZESTRIL) 10 MG tablet     Digestive   Dysphagia    Patient will be referred.  Endoscopy and colonoscopy.         Other   High dependence on smoking    - I instructed the patient to stop smoking and provided them with smoking cessation materials.  - I informed the patient that smoking puts them at increased risk for cancer, COPD, hypertension, and more.  - Informed the patient to seek help if they begin to have trouble breathing, develop chest pain, start to cough up blood, feel faint, or pass out.       Alcohol abuse counseling and surveillance    Patient was advised to stop drinking alcohol        Meds ordered this encounter  Medications   lisinopril (ZESTRIL) 10 MG tablet    Sig: 3 qd in the am's    Dispense:  90 tablet    Refill:  3    Please correct this rx  and cancel the one I sent for one qd    Follow-up: No follow-ups on file.    Corky Downs, MD

## 2021-01-29 NOTE — Assessment & Plan Note (Signed)
Patient will be referred.  Endoscopy and colonoscopy.

## 2021-01-30 ENCOUNTER — Other Ambulatory Visit: Payer: Self-pay | Admitting: Internal Medicine

## 2021-01-30 DIAGNOSIS — I10 Essential (primary) hypertension: Secondary | ICD-10-CM

## 2021-04-10 ENCOUNTER — Ambulatory Visit: Payer: Self-pay | Admitting: Gastroenterology

## 2021-05-19 ENCOUNTER — Other Ambulatory Visit: Payer: Self-pay | Admitting: *Deleted

## 2021-05-19 DIAGNOSIS — I10 Essential (primary) hypertension: Secondary | ICD-10-CM

## 2021-05-19 MED ORDER — AMLODIPINE BESYLATE 5 MG PO TABS: 5.0000 mg | ORAL_TABLET | Freq: Every day | ORAL | 3 refills | Status: DC

## 2021-05-19 MED ORDER — LISINOPRIL 10 MG PO TABS: ORAL_TABLET | ORAL | 3 refills | Status: DC

## 2021-06-24 ENCOUNTER — Other Ambulatory Visit: Payer: Self-pay | Admitting: *Deleted

## 2021-06-24 DIAGNOSIS — I10 Essential (primary) hypertension: Secondary | ICD-10-CM

## 2021-06-24 MED ORDER — LISINOPRIL 10 MG PO TABS: ORAL_TABLET | ORAL | 3 refills | Status: DC

## 2021-08-26 ENCOUNTER — Other Ambulatory Visit: Payer: Self-pay

## 2021-12-08 ENCOUNTER — Ambulatory Visit
Admission: EM | Admit: 2021-12-08 | Discharge: 2021-12-08 | Disposition: A | Discharge status: Home / Self Care | Payer: Self-pay | Attending: Family Medicine | Admitting: Family Medicine

## 2021-12-08 ENCOUNTER — Encounter: Payer: Self-pay | Admitting: Emergency Medicine

## 2021-12-08 DIAGNOSIS — J209 Acute bronchitis, unspecified: Secondary | ICD-10-CM

## 2021-12-08 DIAGNOSIS — I10 Essential (primary) hypertension: Secondary | ICD-10-CM

## 2021-12-08 DIAGNOSIS — R062 Wheezing: Secondary | ICD-10-CM

## 2021-12-08 MED ORDER — AMLODIPINE BESYLATE 10 MG PO TABS: 10.0000 mg | ORAL_TABLET | Freq: Every day | ORAL | 0 refills | Status: DC

## 2021-12-08 MED ORDER — PREDNISONE 20 MG PO TABS: 40.0000 mg | ORAL_TABLET | Freq: Every day | ORAL | 0 refills | Status: DC

## 2021-12-08 MED ORDER — LISINOPRIL 10 MG PO TABS: ORAL_TABLET | ORAL | 0 refills | Status: DC

## 2021-12-08 MED ORDER — ALBUTEROL SULFATE HFA 108 (90 BASE) MCG/ACT IN AERS: 2.0000 | INHALATION_SPRAY | Freq: Four times a day (QID) | RESPIRATORY_TRACT | 0 refills | Status: DC | PRN

## 2021-12-08 MED ORDER — ALBUTEROL SULFATE HFA 108 (90 BASE) MCG/ACT IN AERS
2.0000 | INHALATION_SPRAY | Freq: Once | RESPIRATORY_TRACT | Status: AC
  Administered 2021-12-08: 2 via RESPIRATORY_TRACT

## 2021-12-08 NOTE — ED Provider Notes (Signed)
[facility]    CSN: [id number] Arrival date & time: 12/08/21  1000      History   Chief Complaint Chief Complaint  Patient presents with   Hypertension    HPI Dale Bentley is a 61 y.o. male.   HPI Pt Past Medical History:  Diagnosis Date   Bell's palsy    Hypertension     Patient Active Problem List   Diagnosis Date Noted   Dysphagia 01/29/2021   Primary hypertension 12/16/2020   Cellulitis of left upper extremity 12/16/2020   High dependence on smoking 12/16/2020   Alcohol abuse counseling and surveillance 12/16/2020    History reviewed. No pertinent surgical history.     Home Medications    Prior to Admission medications   Medication Sig Start Date End Date Taking? Authorizing Provider  amLODipine (NORVASC) 5 MG tablet Take 1 tablet (5 mg total) by mouth daily. 05/19/21   Corky Downs, MD  doxycycline (VIBRA-TABS) 100 MG tablet Take 1 tablet (100 mg total) by mouth 2 (two) times daily. 12/16/20   Corky Downs, MD  lisinopril (ZESTRIL) 10 MG tablet TAKE 3 TABLETS BY MOUTH EVERY MORNING 06/24/21   Corky Downs, MD    Family History Family History  Problem Relation Age of Onset   Hypertension Father     Social History Social History   Tobacco Use   Smoking status: Every Day    Packs/day: 1.00    Years: 40.00    Total pack years: 40.00    Types: Cigarettes   Smokeless tobacco: Never  Vaping Use   Vaping Use: Never used  Substance Use Topics   Alcohol use: Yes    Alcohol/week: 6.0 standard drinks of alcohol    Types: 6 Cans of beer per week   Drug use: Never     Allergies   Penicillins   Review of Systems Review of Systems Pertinent negatives listed in HPI  Physical Exam Triage Vital Signs ED Triage Vitals [12/08/21 1011]  Enc Vitals Group     BP      Pulse      Resp      Temp      Temp src      SpO2      Weight      Height      Head Circumference      Peak Flow      Pain Score 7     Pain Loc      Pain Edu?       Excl. in GC?    No data found.  Updated Vital Signs BP (!) 179/103 (BP Location: Left Arm)   Pulse 85   Temp 97.7 F (36.5 C) (Oral)   Resp 18   SpO2 93%   Visual Acuity Right Eye Distance:   Left Eye Distance:   Bilateral Distance:    Right Eye Near:   Left Eye Near:    Bilateral Near:     Physical Exam   UC Treatments / Results  Labs (all labs ordered are listed, but only abnormal results are displayed) Labs Reviewed - No data to display  EKG   Radiology No results found.  Procedures Procedures (including critical care time)  Medications Ordered in UC Medications - No data to display  Initial Impression / Assessment and Plan / UC Course  I have reviewed the triage vital signs and the nursing notes.  Pertinent labs & imaging results that were available during my care of  the patient were reviewed by me and considered in my medical decision making (see chart for details).      Final Clinical Impressions(s) / UC Diagnoses   Final diagnoses:  Accelerated hypertension     Discharge Instructions      Schedule an appointment with Dr. Juel Burrow for follow-up management of your hypertension.  His office has several appointments available for next month, call his office to get scheduled for the next available. You will need to be seen within 4 weeks. I am adding your amlodipine back to your regimen. It is important to maintain routine follow-up with your primary care provider for ongoing management of your  blood pressure.     ED Prescriptions   None    PDMP not reviewed this encounter.

## 2021-12-08 NOTE — ED Triage Notes (Addendum)
Pt states for the last 2-3 days he has had a headache and he feels like his pressure is high. At home reading yesterday was 177/100. He currently takes lisinopril.

## 2021-12-08 NOTE — Discharge Instructions (Addendum)
Schedule an appointment with Dr. Juel Burrow for follow-up management of your hypertension.  I am resuming you back on your amlodipine and creased the dose from 5 to 10 mg once daily.  Also continue your lisinopril 30 mg once daily.  For acute wheezing which I suspect is related to acute bronchitis I prescribed you prednisone 40 mg once daily for 5 days.  I have also prescribed an albuterol inhaler 2 puffs every 4-6 hours as needed for shortness of breath and/or wheezing.  His office has several appointments available for next month, call his office to get scheduled for the next available. You will need to be seen within 4 weeks. I am adding your amlodipine back to your regimen. It is important to maintain routine follow-up with your primary care provider for ongoing management of your  blood pressure.

## 2022-01-07 ENCOUNTER — Encounter: Payer: Self-pay | Admitting: Internal Medicine

## 2022-01-07 ENCOUNTER — Ambulatory Visit (INDEPENDENT_AMBULATORY_CARE_PROVIDER_SITE_OTHER): Payer: Self-pay | Admitting: Internal Medicine

## 2022-01-07 VITALS — BP 122/65 | HR 78 | Ht 72.0 in | Wt 205.0 lb

## 2022-01-07 DIAGNOSIS — I10 Essential (primary) hypertension: Secondary | ICD-10-CM

## 2022-01-07 DIAGNOSIS — R1310 Dysphagia, unspecified: Secondary | ICD-10-CM

## 2022-01-07 DIAGNOSIS — Z7141 Alcohol abuse counseling and surveillance of alcoholic: Secondary | ICD-10-CM

## 2022-01-07 DIAGNOSIS — F17200 Nicotine dependence, unspecified, uncomplicated: Secondary | ICD-10-CM

## 2022-01-07 MED ORDER — AMLODIPINE BESYLATE 10 MG PO TABS: 10.0000 mg | ORAL_TABLET | Freq: Every day | ORAL | 2 refills | Status: DC

## 2022-01-07 MED ORDER — LISINOPRIL 10 MG PO TABS: ORAL_TABLET | ORAL | 2 refills | Status: DC

## 2022-01-07 MED ORDER — LISINOPRIL 40 MG PO TABS: 40.0000 mg | ORAL_TABLET | Freq: Every day | ORAL | 3 refills | Status: DC

## 2022-01-07 NOTE — Assessment & Plan Note (Signed)
Patient was advised to stop drinking 

## 2022-01-07 NOTE — Assessment & Plan Note (Signed)
Resolved

## 2022-01-07 NOTE — Assessment & Plan Note (Signed)
-   I instructed the patient to stop smoking and provided them with smoking cessation materials.  - I informed the patient that smoking puts them at increased risk for cancer, COPD, hypertension, and more.  - Informed the patient to seek help if they begin to have trouble breathing, develop chest pain, start to cough up blood, feel faint, or pass out.  

## 2022-01-07 NOTE — Progress Notes (Signed)
Established Patient Office Visit  Subjective:  Patient ID: Dale Bentley, male    DOB: 25-May-1961  Age: 61 y.o. MRN: [id number]  CC:  Chief Complaint  Patient presents with   Hypertension    Patient is here for a blood pressure check.    Hypertension    Dale Bentley presents for check up  Past Medical History:  Diagnosis Date   Bell's palsy    Hypertension     History reviewed. No pertinent surgical history.  Family History  Problem Relation Age of Onset   Hypertension Father     Social History   Socioeconomic History   Marital status: Married    Spouse name: Not on file   Number of children: Not on file   Years of education: Not on file   Highest education level: Not on file  Occupational History   Not on file  Tobacco Use   Smoking status: Every Day    Packs/day: 1.00    Years: 40.00    Total pack years: 40.00    Types: Cigarettes   Smokeless tobacco: Never  Vaping Use   Vaping Use: Never used  Substance and Sexual Activity   Alcohol use: Yes    Alcohol/week: 6.0 standard drinks of alcohol    Types: 6 Cans of beer per week   Drug use: Never   Sexual activity: Not Currently  Other Topics Concern   Not on file  Social History Narrative   Not on file   Social Determinants of Health   Financial Resource Strain: Not on file  Food Insecurity: Not on file  Transportation Needs: Not on file  Physical Activity: Not on file  Stress: Not on file  Social Connections: Not on file  Intimate Partner Violence: Not on file     Current Outpatient Medications:    albuterol (VENTOLIN HFA) 108 (90 Base) MCG/ACT inhaler, Inhale 2 puffs into the lungs every 6 (six) hours as needed for wheezing or shortness of breath., Disp: 1 each, Rfl: 0   amLODipine (NORVASC) 10 MG tablet, Take 1 tablet (10 mg total) by mouth daily., Disp: 90 tablet, Rfl: 2   lisinopril (ZESTRIL) 40 MG tablet, Take 1 tablet (40 mg total) by mouth daily., Disp: 90 tablet, Rfl: 3    Allergies  Allergen Reactions   Penicillins Hives and Swelling    ROS Review of Systems  Constitutional: Negative.   HENT: Negative.    Eyes: Negative.   Respiratory: Negative.    Cardiovascular: Negative.   Gastrointestinal: Negative.   Endocrine: Negative.   Genitourinary: Negative.   Musculoskeletal: Negative.   Skin: Negative.   Allergic/Immunologic: Negative.   Neurological: Negative.   Hematological: Negative.   Psychiatric/Behavioral: Negative.    All other systems reviewed and are negative.     Objective:    Physical Exam Vitals reviewed.  Constitutional:      Appearance: Normal appearance.  HENT:     Mouth/Throat:     Mouth: Mucous membranes are moist.  Eyes:     Pupils: Pupils are equal, round, and reactive to light.  Neck:     Vascular: No carotid bruit.  Cardiovascular:     Rate and Rhythm: Normal rate and regular rhythm.     Pulses: Normal pulses.     Heart sounds: Normal heart sounds.  Pulmonary:     Effort: Pulmonary effort is normal.     Breath sounds: Normal breath sounds.  Abdominal:     General: Bowel sounds  are normal.     Palpations: Abdomen is soft. There is no hepatomegaly, splenomegaly or mass.     Tenderness: There is no abdominal tenderness.     Hernia: No hernia is present.  Musculoskeletal:     Cervical back: Neck supple.     Right lower leg: No edema.     Left lower leg: No edema.  Skin:    Findings: No rash.  Neurological:     Mental Status: He is alert and oriented to person, place, and time.     Motor: No weakness.  Psychiatric:        Mood and Affect: Mood normal.        Behavior: Behavior normal.     BP 122/65   Pulse 78   Ht 6' (1.829 m)   Wt 205 lb (93 kg)   BMI 27.80 kg/m  Wt Readings from Last 3 Encounters:  01/07/22 205 lb (93 kg)  01/29/21 199 lb 9.6 oz (90.5 kg)  12/30/20 202 lb 6.4 oz (91.8 kg)     Health Maintenance Due  Topic Date Due   COVID-19 Vaccine (1) Never done   HIV Screening   Never done   Hepatitis C Screening  Never done   TETANUS/TDAP  Never done   Zoster Vaccines- Shingrix (1 of 2) Never done    There are no preventive care reminders to display for this patient.  No results found for: "TSH" Lab Results  Component Value Date   WBC 8.1 10/26/2020   HGB 16.0 10/26/2020   HCT 45.0 10/26/2020   MCV 97.0 10/26/2020   PLT 197 10/26/2020   Lab Results  Component Value Date   NA 137 10/26/2020   K 3.7 10/26/2020   CO2 23 10/26/2020   GLUCOSE 103 (H) 10/26/2020   BUN 15 10/26/2020   CREATININE 0.79 10/26/2020   BILITOT 1.0 10/26/2020   ALKPHOS 69 10/26/2020   AST 21 10/26/2020   ALT 18 10/26/2020   PROT 7.6 10/26/2020   ALBUMIN 4.2 10/26/2020   CALCIUM 9.1 10/26/2020   ANIONGAP 10 10/26/2020   No results found for: "CHOL" No results found for: "HDL" No results found for: "LDLCALC" No results found for: "TRIG" No results found for: "CHOLHDL" No results found for: "HGBA1C"    Assessment & Plan:   Problem List Items Addressed This Visit       Cardiovascular and Mediastinum   Primary hypertension     Patient denies any chest pain or shortness of breath there is no history of palpitation or paroxysmal nocturnal dyspnea   patient was advised to follow low-salt low-cholesterol diet    ideally I want to keep systolic blood pressure below 154 mmHg, patient was asked to check blood pressure one times a week and give me a report on that.  Patient will be follow-up in 3 months  or earlier as needed, patient will call me back for any change in the cardiovascular symptoms Patient was advised to buy a book from local bookstore concerning blood pressure and read several chapters  every day.  This will be supplemented by some of the material we will give him from the office.  Patient should also utilize other resources like YouTube and Internet to learn more about the blood pressure and the diet.      Relevant Medications   amLODipine (NORVASC) 10 MG  tablet   lisinopril (ZESTRIL) 40 MG tablet     Digestive   Dysphagia - Primary  Resolved        Other   High dependence on smoking    - I instructed the patient to stop smoking and provided them with smoking cessation materials.  - I informed the patient that smoking puts them at increased risk for cancer, COPD, hypertension, and more.  - Informed the patient to seek help if they begin to have trouble breathing, develop chest pain, start to cough up blood, feel faint, or pass out.      Alcohol abuse counseling and surveillance    Patient was advised to stop drinking       Meds ordered this encounter  Medications   amLODipine (NORVASC) 10 MG tablet    Sig: Take 1 tablet (10 mg total) by mouth daily.    Dispense:  90 tablet    Refill:  2   DISCONTD: lisinopril (ZESTRIL) 10 MG tablet    Sig: TAKE 3 TABLETS BY MOUTH EVERY MORNING    Dispense:  90 tablet    Refill:  2   lisinopril (ZESTRIL) 40 MG tablet    Sig: Take 1 tablet (40 mg total) by mouth daily.    Dispense:  90 tablet    Refill:  3   DASH Diet: Care Instructions Your Care Instructions  The DASH diet is an eating plan that can help lower your blood pressure. DASH stands for Dietary Approaches to Stop Hypertension. Hypertension is high blood pressure. The DASH diet focuses on eating foods that are high in calcium, potassium, and magnesium. These nutrients can lower blood pressure. The foods that are highest in these nutrients are fruits, vegetables, low-fat dairy products, nuts, seeds, and legumes. But taking calcium, potassium, and magnesium supplements instead of eating foods that are high in those nutrients does not have the same effect. The DASH diet also includes whole grains, fish, and poultry. The DASH diet is one of several lifestyle changes your doctor may recommend to lower your high blood pressure. Your doctor may also want you to decrease the amount of sodium in your diet. Lowering sodium while following the  DASH diet can lower blood pressure even further than just the DASH diet alone. Follow-up care is a key part of your treatment and safety. Be sure to make and go to all appointments, and call your doctor if you are having problems. It's also a good idea to know your test results and keep a list of the medicines you take. How can you care for yourself at home? Following the DASH diet  Eat 4 to 5 servings of fruit each day. A serving is 1 medium-sized piece of fruit,  cup chopped or canned fruit, 1/4 cup dried fruit, or 4 ounces ( cup) of fruit juice. Choose fruit more often than fruit juice.  Eat 4 to 5 servings of vegetables each day. A serving is 1 cup of lettuce or raw leafy vegetables,  cup of chopped or cooked vegetables, or 4 ounces ( cup) of vegetable juice. Choose vegetables more often than vegetable juice.  Get 2 to 3 servings of low-fat and fat-free dairy each day. A serving is 8 ounces of milk, 1 cup of yogurt, or 1  ounces of cheese.  Eat 6 to 8 servings of grains each day. A serving is 1 slice of bread, 1 ounce of dry cereal, or  cup of cooked rice, pasta, or cooked cereal. Try to choose whole-grain products as much as possible.  Limit lean meat, poultry, and fish to 2 servings each  day. A serving is 3 ounces, about the size of a deck of cards.  Eat 4 to 5 servings of nuts, seeds, and legumes (cooked dried beans, lentils, and split peas) each week. A serving is 1/3 cup of nuts, 2 tablespoons of seeds, or  cup of cooked beans or peas.  Limit fats and oils to 2 to 3 servings each day. A serving is 1 teaspoon of vegetable oil or 2 tablespoons of salad dressing.  Limit sweets and added sugars to 5 servings or less a week. A serving is 1 tablespoon jelly or jam,  cup sorbet, or 1 cup of lemonade.  Eat less than 2,300 milligrams (mg) of sodium a day. If you limit your sodium to 1,500 mg a day, you can lower your blood pressure even more. Tips for success  Start small. Do not try  to make dramatic changes to your diet all at once. You might feel that you are missing out on your favorite foods and then be more likely to not follow the plan. Make small changes, and stick with them. Once those changes become habit, add a few more changes.  Try some of the following: ? Make it a goal to eat a fruit or vegetable at every meal and at snacks. This will make it easy to get the recommended amount of fruits and vegetables each day. ? Try yogurt topped with fruit and nuts for a snack or healthy dessert. ? Add lettuce, tomato, cucumber, and onion to sandwiches. ? Combine a ready-made pizza crust with low-fat mozzarella cheese and lots of vegetable toppings. Try using tomatoes, squash, spinach, broccoli, carrots, cauliflower, and onions. ? Have a variety of cut-up vegetables with a low-fat dip as an appetizer instead of chips and dip. ? Sprinkle sunflower seeds or chopped almonds over salads. Or try adding chopped walnuts or almonds to cooked vegetables. ? Try some vegetarian meals using beans and peas. Add garbanzo or kidney beans to salads. Make burritos and tacos with mashed pinto beans or black beans. Follow-up: No follow-ups on file.    Corky Downs, MD

## 2022-01-07 NOTE — Assessment & Plan Note (Signed)

## 2022-01-20 ENCOUNTER — Ambulatory Visit (INDEPENDENT_AMBULATORY_CARE_PROVIDER_SITE_OTHER): Payer: Self-pay

## 2022-01-20 ENCOUNTER — Encounter: Payer: Self-pay | Admitting: Emergency Medicine

## 2022-01-20 ENCOUNTER — Ambulatory Visit
Admission: EM | Admit: 2022-01-20 | Discharge: 2022-01-20 | Disposition: A | Discharge status: Home / Self Care | Payer: Self-pay | Attending: Emergency Medicine | Admitting: Emergency Medicine

## 2022-01-20 ENCOUNTER — Other Ambulatory Visit: Payer: Self-pay

## 2022-01-20 DIAGNOSIS — R0602 Shortness of breath: Secondary | ICD-10-CM

## 2022-01-20 DIAGNOSIS — J441 Chronic obstructive pulmonary disease with (acute) exacerbation: Secondary | ICD-10-CM

## 2022-01-20 MED ORDER — AZITHROMYCIN 250 MG PO TABS: 250.0000 mg | ORAL_TABLET | Freq: Every day | ORAL | 0 refills | Status: DC

## 2022-01-20 MED ORDER — ALBUTEROL SULFATE (2.5 MG/3ML) 0.083% IN NEBU: 2.5000 mg | INHALATION_SOLUTION | Freq: Four times a day (QID) | RESPIRATORY_TRACT | 12 refills | Status: DC | PRN

## 2022-01-20 MED ORDER — PREDNISONE 20 MG PO TABS: 40.0000 mg | ORAL_TABLET | Freq: Every day | ORAL | 0 refills | Status: DC

## 2022-01-20 NOTE — ED Triage Notes (Signed)
Patient has taken a half of xanax, helped rest, but no improvement in symptoms  Friend brought him a nebulizer, used once, seemed to help, but also made him feel very jittery

## 2022-01-20 NOTE — ED Provider Notes (Signed)
Dale Bentley    CSN: [id number] Arrival date & time: 01/20/22  0957      History   Chief Complaint Chief Complaint  Patient presents with   Shortness of Breath    HPI Dale Bentley is a 61 y.o. male.   Patient presents with nasal congestion, a productive cough, shortness of breath at rest worsened by exertion and intermittent wheezing for 3 weeks.  Tolerating food and liquids albuterol nebulizer which was helpful but caused him to become jittery and a Xanax. history of hypertension. Daily smoker but has been unable to tolerate cigarettes for 7 days.  Denies chest pain or tightness, fever, chills, body aches, ear pain, sore throat.   Past Medical History:  Diagnosis Date   Bell's palsy    Hypertension     Patient Active Problem List   Diagnosis Date Noted   Dysphagia 01/29/2021   Primary hypertension 12/16/2020   Cellulitis of left upper extremity 12/16/2020   High dependence on smoking 12/16/2020   Alcohol abuse counseling and surveillance 12/16/2020    Past Surgical History:  Procedure Laterality Date   TONSILLECTOMY         Home Medications    Prior to Admission medications   Medication Sig Start Date End Date Taking? Authorizing Provider  albuterol (VENTOLIN HFA) 108 (90 Base) MCG/ACT inhaler Inhale 2 puffs into the lungs every 6 (six) hours as needed for wheezing or shortness of breath. 12/08/21   Bing Neighbors, FNP  amLODipine (NORVASC) 10 MG tablet Take 1 tablet (10 mg total) by mouth daily. 01/07/22   Corky Downs, MD  lisinopril (ZESTRIL) 40 MG tablet Take 1 tablet (40 mg total) by mouth daily. 01/07/22   Corky Downs, MD    Family History Family History  Problem Relation Age of Onset   Hypertension Father     Social History Social History   Tobacco Use   Smoking status: Every Day    Packs/day: 1.00    Years: 40.00    Total pack years: 40.00    Types: Cigarettes   Smokeless tobacco: Never  Vaping Use   Vaping Use: Never used   Substance Use Topics   Alcohol use: Yes    Alcohol/week: 6.0 standard drinks of alcohol    Types: 6 Cans of beer per week   Drug use: Never     Allergies   Penicillins   Review of Systems Review of Systems  Constitutional: Negative.   HENT: Negative.    Respiratory:  Positive for cough, shortness of breath and wheezing. Negative for apnea, choking, chest tightness and stridor.   Cardiovascular: Negative.   Gastrointestinal: Negative.      Physical Exam Triage Vital Signs ED Triage Vitals  Enc Vitals Group     BP 01/20/22 1008 (!) 149/83     Pulse Rate 01/20/22 1008 80     Resp 01/20/22 1008 (!) 24     Temp 01/20/22 1008 97.9 F (36.6 C)     Temp Source 01/20/22 1008 Oral     SpO2 01/20/22 1008 92 %     Weight --      Height --      Head Circumference --      Peak Flow --      Pain Score 01/20/22 1004 0     Pain Loc --      Pain Edu? --      Excl. in GC? --    No data found.  Updated Vital  Signs BP (!) 149/83 (BP Location: Left Arm)   Pulse 80   Temp 97.9 F (36.6 C) (Oral)   Resp (!) 24   SpO2 92%   Visual Acuity Right Eye Distance:   Left Eye Distance:   Bilateral Distance:    Right Eye Near:   Left Eye Near:    Bilateral Near:     Physical Exam Constitutional:      Appearance: Normal appearance. He is well-developed.  HENT:     Head: Normocephalic.  Eyes:     Extraocular Movements: Extraocular movements intact.  Cardiovascular:     Rate and Rhythm: Normal rate and regular rhythm.     Pulses: Normal pulses.     Heart sounds: Normal heart sounds.  Pulmonary:     Effort: Pulmonary effort is normal.     Breath sounds: Wheezing present.  Musculoskeletal:     Cervical back: Normal range of motion and neck supple.  Skin:    General: Skin is warm and dry.  Neurological:     Mental Status: He is alert and oriented to person, place, and time. Mental status is at baseline.  Psychiatric:        Mood and Affect: Mood normal.        Behavior:  Behavior normal.      UC Treatments / Results  Labs (all labs ordered are listed, but only abnormal results are displayed) Labs Reviewed - No data to display  EKG   Radiology No results found.  Procedures Procedures (including critical care time)  Medications Ordered in UC Medications - No data to display  Initial Impression / Assessment and Plan / UC Course  I have reviewed the triage vital signs and the nursing notes.  Pertinent labs & imaging results that were available during my care of the patient were reviewed by me and considered in my medical decision making (see chart for details).  Chronic bronchitis, shortness of breath  Vital signs are stable, O2 saturation 92% on room air, patient is nontoxic-appearing nor any signs of distress, stable for outpatient treatment, chest x-ray to confirm chronic bronchitis, discussed with patient along with etiology and given written handout regarding COPD, prescribed prednisone, albuterol nebulizer and a Z-Pak for treatment, may use additional over the counter recommended follow-up with PCP if symptoms persist, may follow-up with as needed Final Clinical Impressions(s) / UC Diagnoses   Final diagnoses:  SOB (shortness of breath)   Discharge Instructions   None    ED Prescriptions   None    PDMP not reviewed this encounter.   Valinda Hoar, NP 01/20/22 1048

## 2022-01-20 NOTE — ED Triage Notes (Signed)
Patient was seen here 12/08/2021.  Was started on blood pressure medicine at that time.  Since then has not felt well.  Complains of sob.  Denies chest pain.  Wakes up with night sweats.  Patient has not smoked a cigarette in over a week.

## 2022-01-20 NOTE — Discharge Instructions (Signed)
Today you are being treated for bronchitis which was confirmed via your chest x-ray  Bronchitis is a form of chronic obstructive pulmonary disease which is most likely a result of your cigarette use, inside your packet is more information on this disease process  Begin use of prednisone every morning with food for 5 days, this is going to reduce the inflammation and irritation to your lungs which will allow you to breathe better  Azithromycin as directed on packaging, this will provide coverage for bacteria that may be exacerbating her symptoms  Use nebulizer solution every 6 hours as needed to help manage her breathing, it is normal to feel mildly jittery after use, this will subside  For coughing you may use over-the-counter Delsym as directed on packaging  If having trouble sleeping you may attempt a dose of melatonin or Benadryl which may be found over-the-counter  follow-up with your primary doctor within the next 2 weeks for reevaluation if symptoms have not subsided  You may follow-up with urgent care at any point as needed

## 2022-02-06 IMAGING — MR MR HEAD W/O CM
13 series · 48 of 48 positions shown · non-contrast
Comparison: None.

CLINICAL DATA: Left-sided facial droop. First noticed 11 hours ago.

EXAM:
MRI HEAD WITHOUT CONTRAST
TECHNIQUE: Multiplanar, multiecho pulse sequences of the brain and surrounding
structures were obtained without intravenous contrast.

[Series 5: ax dwi_tracew · axial · 3.0mm · 0.71mm/px · z∈[-64,+100]mm · 8 of 112 slices shown]
[im 1/112]
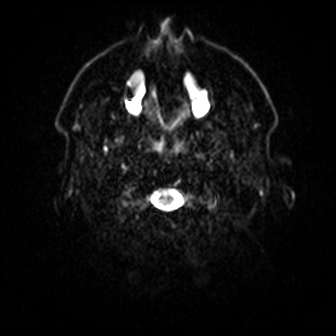
[im 16/112]
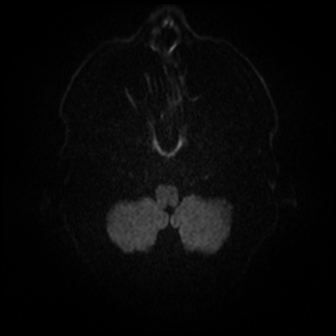
[im 32/112]
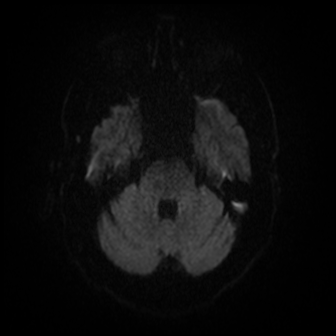
[im 48/112]
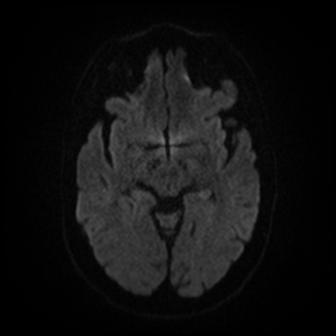
[im 64/112]
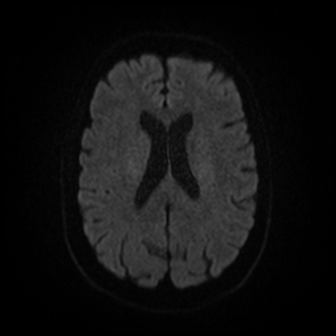
[im 80/112]
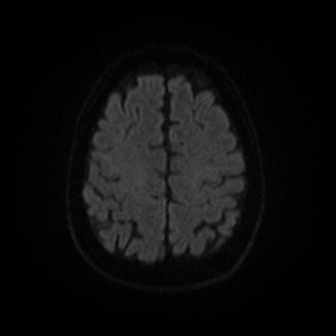
[im 96/112]
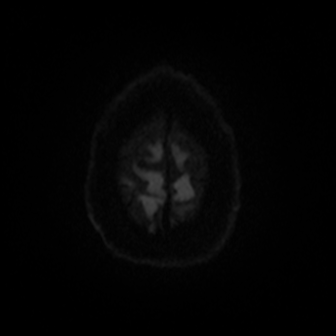
[im 112/112]
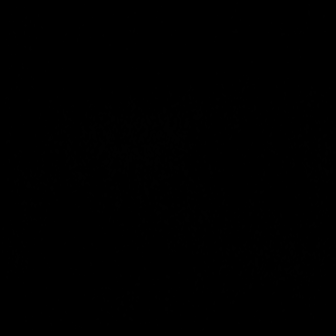

[Series 6: ax dwi_adc · axial · 3.0mm · 0.71mm/px · z∈[-64,+97]mm · 3 of 55 slices shown]
[im 1/55]
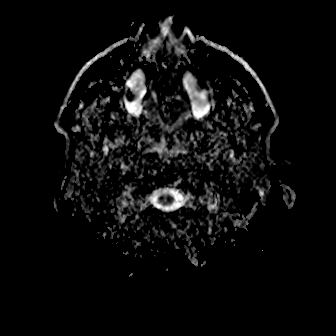
[im 28/55]
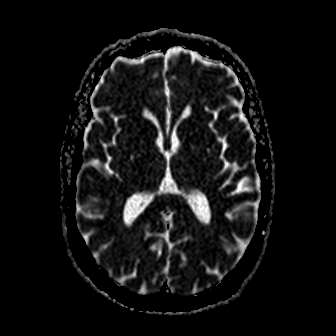
[im 55/55]
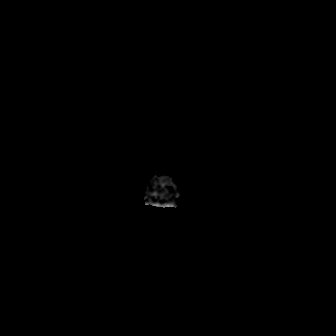

[Series 7: cor dwi_tracew · coronal · 5.0mm · 0.68mm/px · 5 of 80 slices shown]
[im 1/80]
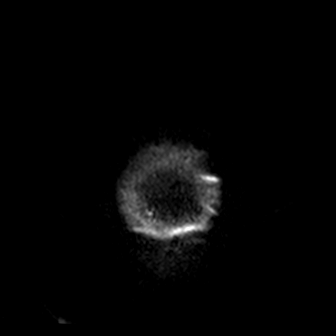
[im 20/80]
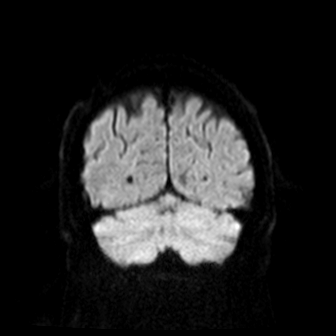
[im 40/80]
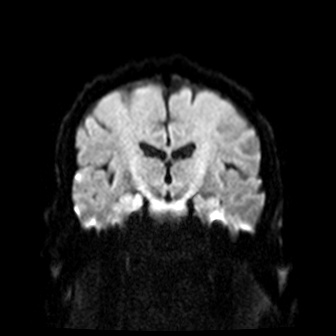
[im 60/80]
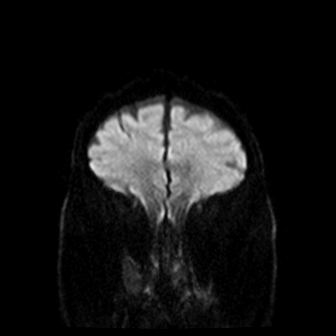
[im 80/80]
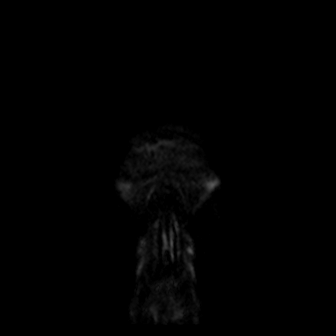

[Series 8: cor dwi_adc · coronal · 5.0mm · 0.68mm/px · 2 of 40 slices shown]
[im 1/40]
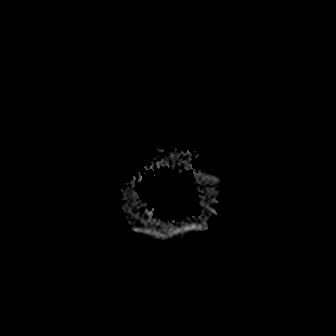
[im 40/40]
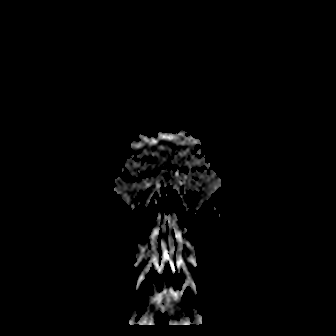

[Series 9: T1 · sagittal · 5.0mm · 0.47mm/px · 1 of 24 slices shown (1 of 2)]
[im 1/24]
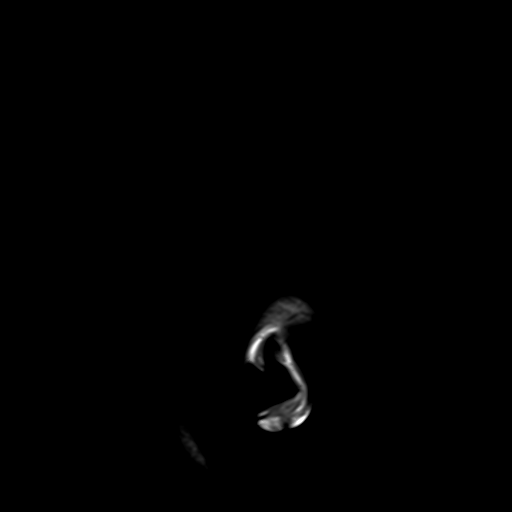

[Series 10: T2 · axial · 5.0mm · 0.86mm/px · z∈[-60,+95]mm · 2 of 27 slices shown (1 of 2)]
[im 1/27]
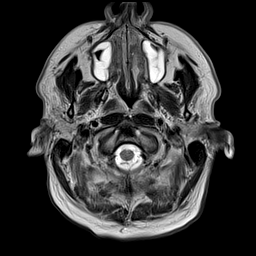
[im 27/27]
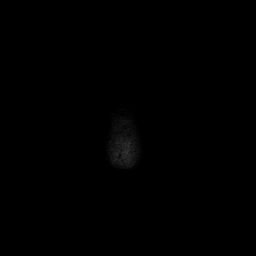

[Series 11: mag_images · axial · 3.0mm · 0.90mm/px · z∈[-58,+94]mm · 3 of 52 slices shown]
[im 1/52]
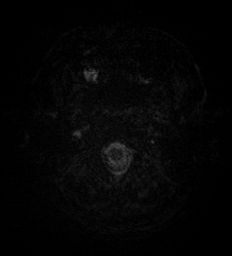
[im 26/52]
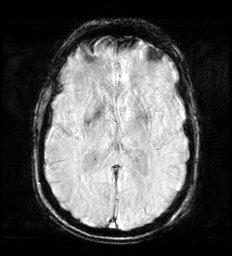
[im 52/52]
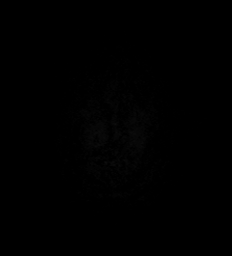

[Series 12: pha_images · axial · 3.0mm · 0.90mm/px · z∈[-58,+94]mm · 3 of 52 slices shown]
[im 1/52]
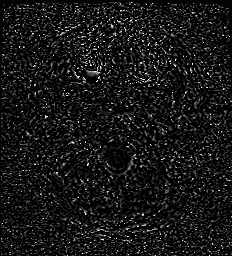
[im 26/52]
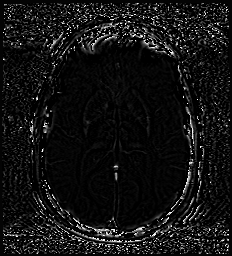
[im 52/52]
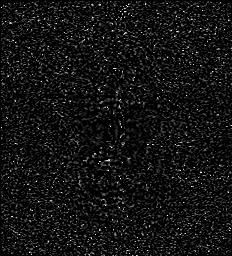

[Series 13: swi_images · axial · 3.0mm · 0.90mm/px · z∈[-58,+94]mm · 3 of 52 slices shown]
[im 1/52]
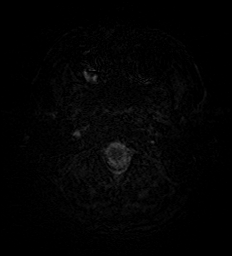
[im 26/52]
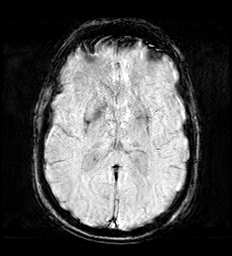
[im 52/52]
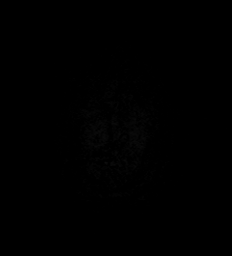

[Series 14: mip_images(sw) · axial · 24.0mm · 0.90mm/px · z∈[-48,+83]mm · 3 of 45 slices shown]
[im 1/45]
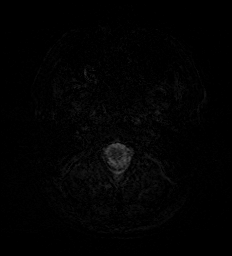
[im 23/45]
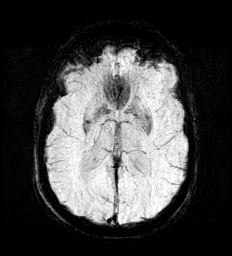
[im 45/45]
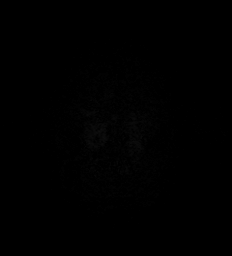

[Series 15: FLAIR · axial · 3.0mm · 0.69mm/px · z∈[-63,+98]mm · 3 of 55 slices shown]
[im 1/55]
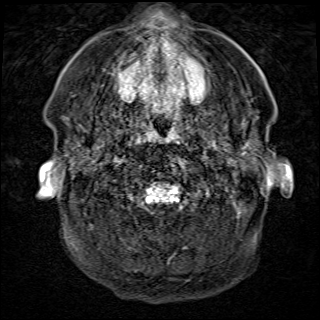
[im 28/55]
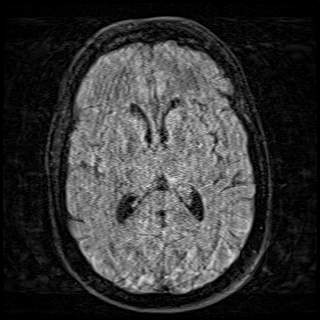
[im 55/55]
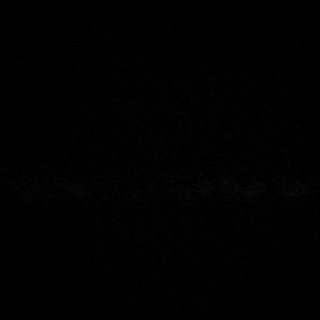

[Series 16: T1 · axial · 1.0mm · 0.98mm/px · z∈[-68,+106]mm · 10 of 172 slices shown (2 of 2)]
[im 1/172]
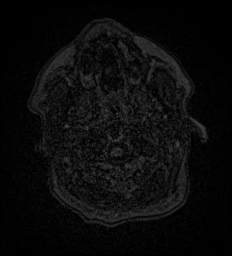
[im 20/172]
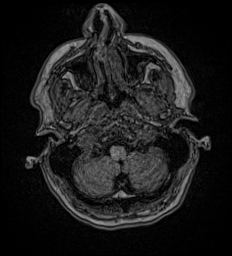
[im 39/172]
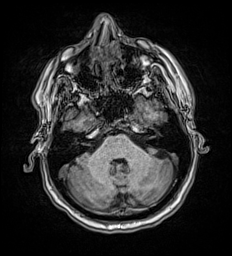
[im 58/172]
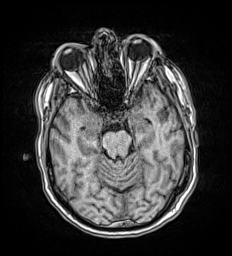
[im 77/172]
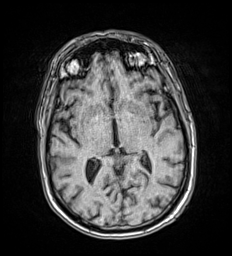
[im 96/172]
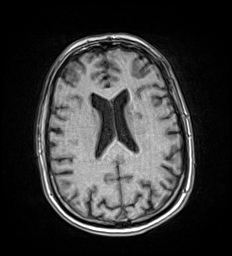
[im 115/172]
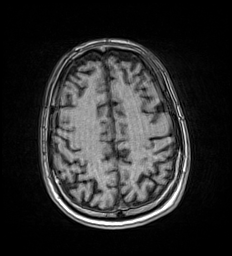
[im 134/172]
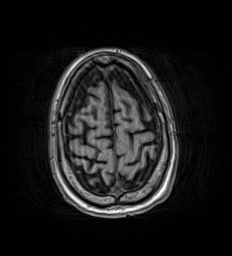
[im 153/172]
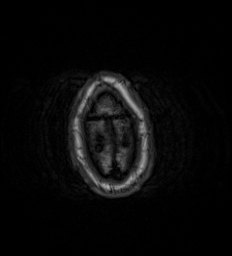
[im 172/172]
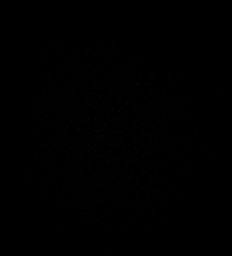

[Series 17: T2 · coronal · 5.0mm · 0.86mm/px · 2 of 30 slices shown (2 of 2)]
[im 1/30]
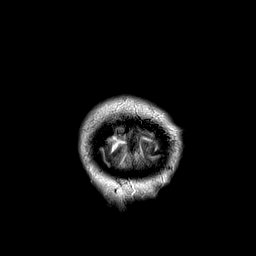
[im 30/30]
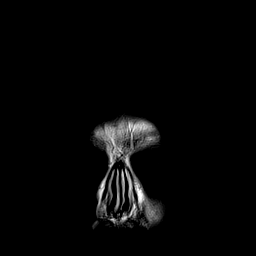

[48 of 48 positions shown; findings below may reference images not displayed]

FINDINGS: Brain: Diffusion-weighted images demonstrate no acute or subacute
infarction. Remote nonhemorrhagic lacunar infarct is present in the
left corona radiata. Mild generalized atrophy is present. No other
acute or focal infarct is present. The ventricles are of normal
size. No significant extraaxial fluid collection is present.

The internal auditory canals are within normal limits. The brainstem
and cerebellum are within normal limits.

Vascular: Flow is present in the major intracranial arteries.

Skull and upper cervical spine: The craniocervical junction is
normal. Upper cervical spine is within normal limits. Marrow signal
is unremarkable.

Sinuses/Orbits: Circumferential mucosal thickening is present in the
inferior maxillary sinuses bilaterally. No fluid levels are present.
The paranasal sinuses and mastoid air cells are otherwise clear. The
globes and orbits are within normal limits.
IMPRESSION: 1. No acute intracranial abnormality.
2. Remote nonhemorrhagic lacunar infarct of the left corona radiata.
3. Mild generalized atrophy.
4. Mild maxillary sinus disease.

## 2022-02-24 ENCOUNTER — Encounter: Payer: Self-pay | Admitting: Internal Medicine

## 2022-02-24 ENCOUNTER — Ambulatory Visit (INDEPENDENT_AMBULATORY_CARE_PROVIDER_SITE_OTHER): Payer: Self-pay | Admitting: Internal Medicine

## 2022-02-24 VITALS — BP 132/83 | HR 96 | Ht 72.0 in | Wt 192.0 lb

## 2022-02-24 DIAGNOSIS — R0602 Shortness of breath: Secondary | ICD-10-CM

## 2022-02-24 DIAGNOSIS — F17200 Nicotine dependence, unspecified, uncomplicated: Secondary | ICD-10-CM

## 2022-02-24 DIAGNOSIS — R1310 Dysphagia, unspecified: Secondary | ICD-10-CM

## 2022-02-24 DIAGNOSIS — I10 Essential (primary) hypertension: Secondary | ICD-10-CM

## 2022-02-24 DIAGNOSIS — Z7141 Alcohol abuse counseling and surveillance of alcoholic: Secondary | ICD-10-CM

## 2022-02-24 MED ORDER — SPIRONOLACTONE 25 MG PO TABS: 25.0000 mg | ORAL_TABLET | Freq: Every day | ORAL | 3 refills | Status: DC

## 2022-02-24 NOTE — Assessment & Plan Note (Signed)
Patient drinks 6 beers a day.  I told him to stop drinking completely.  he will take thiamine 100 mg p.o. daily folic acid 1 mg p.o. daily.  EKG does not show any acute changes I reviewed the chest x-ray in the hospital and it was okay without any cardiac enlargement.  lab tests are okay.  He says he has stopped smoking

## 2022-02-24 NOTE — Progress Notes (Signed)
Established Patient Office Visit  Subjective:  Patient ID: Dale Bentley, male    DOB: [birth year]  Age: 61 y.o. MRN: [id number]  CC:  Chief Complaint  Patient presents with   trouble breathing     Patient quit smoking about 2 months ago and has been having trouble breathing since and coughing. Patient says the inhaler and nebulizer help very little.  Patient states that the heat make him cough worse .    HPI  Dale Bentley presents for sob on walking  Past Medical History:  Diagnosis Date   Bell's palsy    Hypertension     Past Surgical History:  Procedure Laterality Date   TONSILLECTOMY      Family History  Problem Relation Age of Onset   Hypertension Father     Social History   Socioeconomic History   Marital status: Married    Spouse name: Not on file   Number of children: Not on file   Years of education: Not on file   Highest education level: Not on file  Occupational History   Not on file  Tobacco Use   Smoking status: Every Day    Packs/day: 1.00    Years: 40.00    Total pack years: 40.00    Types: Cigarettes   Smokeless tobacco: Never  Vaping Use   Vaping Use: Never used  Substance and Sexual Activity   Alcohol use: Yes    Alcohol/week: 6.0 standard drinks of alcohol    Types: 6 Cans of beer per week   Drug use: Never   Sexual activity: Not Currently  Other Topics Concern   Not on file  Social History Narrative   Not on file   Social Determinants of Health   Financial Resource Strain: Not on file  Food Insecurity: Not on file  Transportation Needs: Not on file  Physical Activity: Not on file  Stress: Not on file  Social Connections: Not on file  Intimate Partner Violence: Not on file     Current Outpatient Medications:    albuterol (PROVENTIL) (2.5 MG/3ML) 0.083% nebulizer solution, Take 3 mLs (2.5 mg total) by nebulization every 6 (six) hours as needed for wheezing or shortness of breath., Disp: 75 mL, Rfl: 12   albuterol  (VENTOLIN HFA) 108 (90 Base) MCG/ACT inhaler, Inhale 2 puffs into the lungs every 6 (six) hours as needed for wheezing or shortness of breath., Disp: 1 each, Rfl: 0   amLODipine (NORVASC) 10 MG tablet, Take 1 tablet (10 mg total) by mouth daily., Disp: 90 tablet, Rfl: 2   lisinopril (ZESTRIL) 40 MG tablet, Take 1 tablet (40 mg total) by mouth daily., Disp: 90 tablet, Rfl: 3   predniSONE (DELTASONE) 20 MG tablet, Take 2 tablets (40 mg total) by mouth daily., Disp: 10 tablet, Rfl: 0   spironolactone (ALDACTONE) 25 MG tablet, Take 1 tablet (25 mg total) by mouth daily., Disp: 90 tablet, Rfl: 3   Allergies  Allergen Reactions   Penicillins Hives and Swelling    ROS Review of Systems  Constitutional: Negative.   HENT: Negative.    Eyes: Negative.   Respiratory:  Positive for shortness of breath and wheezing. Negative for cough, choking and chest tightness.   Cardiovascular: Negative.  Negative for leg swelling.  Gastrointestinal: Negative.  Negative for abdominal distention and blood in stool.  Endocrine: Negative.   Genitourinary: Negative.   Musculoskeletal: Negative.  Negative for back pain.  Skin: Negative.   Allergic/Immunologic: Negative.  Neurological: Negative.   Hematological: Negative.   Psychiatric/Behavioral: Negative.    All other systems reviewed and are negative.     Objective:    Physical Exam Constitutional:      Appearance: He is normal weight. He is not toxic-appearing.  HENT:     Mouth/Throat:     Mouth: Mucous membranes are moist.  Cardiovascular:     Rate and Rhythm: Normal rate.     Heart sounds: No murmur heard. Pulmonary:     Breath sounds: No rhonchi.  Chest:     Chest wall: No tenderness.  Abdominal:     Tenderness: There is no abdominal tenderness.  Skin:    General: Skin is warm.  Neurological:     General: No focal deficit present.  Psychiatric:        Mood and Affect: Mood normal.     BP 132/83   Pulse 96   Ht 6' (1.829 m)   Wt  192 lb (87.1 kg)   BMI 26.04 kg/m  Wt Readings from Last 3 Encounters:  02/24/22 192 lb (87.1 kg)  01/07/22 205 lb (93 kg)  01/29/21 199 lb 9.6 oz (90.5 kg)     Health Maintenance Due  Topic Date Due   COVID-19 Vaccine (1) Never done   HIV Screening  Never done   Hepatitis C Screening  Never done   TETANUS/TDAP  Never done   Zoster Vaccines- Shingrix (1 of 2) Never done   INFLUENZA VACCINE  Never done    There are no preventive care reminders to display for this patient.  No results found for: "TSH" Lab Results  Component Value Date   WBC 8.1 10/26/2020   HGB 16.0 10/26/2020   HCT 45.0 10/26/2020   MCV 97.0 10/26/2020   PLT 197 10/26/2020   Lab Results  Component Value Date   NA 137 10/26/2020   K 3.7 10/26/2020   CO2 23 10/26/2020   GLUCOSE 103 (H) 10/26/2020   BUN 15 10/26/2020   CREATININE 0.79 10/26/2020   BILITOT 1.0 10/26/2020   ALKPHOS 69 10/26/2020   AST 21 10/26/2020   ALT 18 10/26/2020   PROT 7.6 10/26/2020   ALBUMIN 4.2 10/26/2020   CALCIUM 9.1 10/26/2020   ANIONGAP 10 10/26/2020   No results found for: "CHOL" No results found for: "HDL" No results found for: "LDLCALC" No results found for: "TRIG" No results found for: "CHOLHDL" No results found for: "HGBA1C"    Assessment & Plan:   Problem List Items Addressed This Visit       Cardiovascular and Mediastinum   Primary hypertension - Primary     Patient denies any chest pain or shortness of breath there is no history of palpitation or paroxysmal nocturnal dyspnea   patient was advised to follow low-salt low-cholesterol diet    ideally I want to keep systolic blood pressure below 130 mmHg, patient was asked to check blood pressure one times a week and give me a report on that.  Patient will be follow-up in 3 months  or earlier as needed, patient will call me back for any change in the cardiovascular symptoms Patient was advised to buy a book from local bookstore concerning blood pressure and  read several chapters  every day.  This will be supplemented by some of the material we will give him from the office.  Patient should also utilize other resources like YouTube and Internet to learn more about the blood pressure and the diet.  Relevant Medications   spironolactone (ALDACTONE) 25 MG tablet     Digestive   Dysphagia    Denies any history of dysphagia        Other   High dependence on smoking    - I instructed the patient to stop smoking and provided them with smoking cessation materials.  - I informed the patient that smoking puts them at increased risk for cancer, COPD, hypertension, and more.  - Informed the patient to seek help if they begin to have trouble breathing, develop chest pain, start to cough up blood, feel faint, or pass out.      Alcohol abuse counseling and surveillance    Patient drinks 6 beers a day.  I told him to stop drinking completely.  he will take thiamine 100 mg p.o. daily folic acid 1 mg p.o. daily.  EKG does not show any acute changes I reviewed the chest x-ray in the hospital and it was okay without any cardiac enlargement.  lab tests are okay.  He says he has stopped smoking      Other Visit Diagnoses     SOB (shortness of breath)       Relevant Orders   EKG 12-Lead     Report of the electrocardiogram. Normal sinus rhythm no acute changes were noted.  Meds ordered this encounter  Medications   spironolactone (ALDACTONE) 25 MG tablet    Sig: Take 1 tablet (25 mg total) by mouth daily.    Dispense:  90 tablet    Refill:  3    Follow-up: No follow-ups on file.    Corky Downs, MD

## 2022-02-24 NOTE — Assessment & Plan Note (Signed)
Denies any history of dysphagia

## 2022-02-24 NOTE — Assessment & Plan Note (Signed)
-   I instructed the patient to stop smoking and provided them with smoking cessation materials.  - I informed the patient that smoking puts them at increased risk for cancer, COPD, hypertension, and more.  - Informed the patient to seek help if they begin to have trouble breathing, develop chest pain, start to cough up blood, feel faint, or pass out.  

## 2022-02-24 NOTE — Assessment & Plan Note (Signed)

## 2022-03-11 ENCOUNTER — Ambulatory Visit: Payer: Self-pay | Admitting: Internal Medicine

## 2023-03-05 ENCOUNTER — Ambulatory Visit
Admission: EM | Admit: 2023-03-05 | Discharge: 2023-03-05 | Disposition: A | Discharge status: Home / Self Care | Payer: Self-pay | Attending: Family Medicine | Admitting: Family Medicine

## 2023-03-05 ENCOUNTER — Encounter: Payer: Self-pay | Admitting: Emergency Medicine

## 2023-03-05 ENCOUNTER — Other Ambulatory Visit: Payer: Self-pay

## 2023-03-05 DIAGNOSIS — I10 Essential (primary) hypertension: Secondary | ICD-10-CM

## 2023-03-05 DIAGNOSIS — R0789 Other chest pain: Secondary | ICD-10-CM

## 2023-03-05 MED ORDER — AMLODIPINE BESYLATE 10 MG PO TABS: 10.0000 mg | ORAL_TABLET | Freq: Every day | ORAL | 0 refills | Status: DC

## 2023-03-05 NOTE — ED Triage Notes (Signed)
Patient presents to [facility] for evaluation of high blood pressure at home, says 180's over 100s at home.  Headache x 2 weeks.  Denis numbness, weakness.

## 2023-03-05 NOTE — ED Provider Notes (Signed)
[facility]    CSN: [id number] Arrival date & time: 03/05/23  0932      History   Chief Complaint Chief Complaint  Patient presents with   Hypertension    HPI Dale Bentley is a 62 y.o. male.    Hypertension  Patient here for evaluation of worsening home BP readings x 2 weeks. Patient previously followed at [facility] for primary care services.  Per chart review patient's last primary care appointment at [facility] was back on February 25, 2022. Last lab studies for patient are dated back to 2022. Patient reports today intermittent headaches over the last few months. However reports over the last couple weeks experiencing intermittent chest pain, which he describes as "just enough for me to know that it's there". Endorses SOB but he relates this symptoms as a chronic problem related to COPD and continued smoking. Patient last medication regimen for management of hypertension includes: Amlodipine, Lisinopril, and Spirolactone.  Reports that when he was taking the spironolactone he only took for approximately a week as he started experiencing hallucinations seeing images and alae intact.  Canal wall up until the seventh day of taking the medication.  He reports once he stopped the medication he did not have these hallucinations any longer.  Patient reports that he has taken amlodipine and lisinopril.  Since his last primary care visit and stopped taking medication about 1 month ago when he ran out and at that time he decided not to attempt to get any additional medication as he wanted to wean himself off of blood pressure medicines.  He denies any visual changes or blurriness of vision.  Denies any new severe headache. Past Medical History:  Diagnosis Date   Bell's palsy    Hypertension     Patient Active Problem List   Diagnosis Date Noted   Dysphagia 01/29/2021   Primary hypertension 12/16/2020   Cellulitis of left upper extremity 12/16/2020   High  dependence on smoking 12/16/2020   Alcohol abuse counseling and surveillance 12/16/2020    Past Surgical History:  Procedure Laterality Date   TONSILLECTOMY         Home Medications    Prior to Admission medications   Medication Sig Start Date End Date Taking? Authorizing Provider  albuterol (PROVENTIL) (2.5 MG/3ML) 0.083% nebulizer solution Take 3 mLs (2.5 mg total) by nebulization every 6 (six) hours as needed for wheezing or shortness of breath. 01/20/22   White, Elita Boone, NP  albuterol (VENTOLIN HFA) 108 (90 Base) MCG/ACT inhaler Inhale 2 puffs into the lungs every 6 (six) hours as needed for wheezing or shortness of breath. 12/08/21   Bing Neighbors, NP  amLODipine (NORVASC) 10 MG tablet Take 1 tablet (10 mg total) by mouth daily. 03/05/23   Bing Neighbors, NP  lisinopril (ZESTRIL) 40 MG tablet Take 1 tablet (40 mg total) by mouth daily. 01/07/22   Corky Downs, MD  spironolactone (ALDACTONE) 25 MG tablet Take 1 tablet (25 mg total) by mouth daily. 02/24/22   Corky Downs, MD    Family History Family History  Problem Relation Age of Onset   Cancer Father    Hypertension Father    Cancer Sister     Social History Social History   Tobacco Use   Smoking status: Every Day    Current packs/day: 1.00    Average packs/day: 1 pack/day for 40.0 years (40.0 ttl pk-yrs)    Types: Cigarettes   Smokeless tobacco: Never  Vaping Use   Vaping status: Never Used  Substance Use Topics   Alcohol use: Yes    Alcohol/week: 6.0 standard drinks of alcohol    Types: 6 Cans of beer per week   Drug use: Never     Allergies   Aldactone [spironolactone] and Penicillins   Review of Systems Review of Systems Pertinent negatives listed in HPI   Physical Exam Triage Vital Signs ED Triage Vitals  Encounter Vitals Group     BP 03/05/23 1058 (!) 142/76     Systolic BP Percentile --      Diastolic BP Percentile --      Pulse Rate 03/05/23 1058 85     Resp 03/05/23 1058 18      Temp 03/05/23 1058 98 F (36.7 C)     Temp Source 03/05/23 1058 Oral     SpO2 03/05/23 1058 98 %     Weight --      Height --      Head Circumference --      Peak Flow --      Pain Score 03/05/23 1056 9     Pain Loc --      Pain Education --      Exclude from Growth Chart --    No data found.  Updated Vital Signs BP (!) 142/76 (BP Location: Left Arm)   Pulse 85   Temp 98 F (36.7 C) (Oral)   Resp 18   SpO2 98%   Visual Acuity Right Eye Distance:   Left Eye Distance:   Bilateral Distance:    Right Eye Near:   Left Eye Near:    Bilateral Near:     Physical Exam Vitals reviewed.  Constitutional:      Appearance: Normal appearance.  HENT:     Head: Normocephalic and atraumatic.  Eyes:     Extraocular Movements: Extraocular movements intact.     Conjunctiva/sclera: Conjunctivae normal.     Pupils: Pupils are equal, round, and reactive to light.  Cardiovascular:     Rate and Rhythm: Normal rate and regular rhythm.  Pulmonary:     Breath sounds: Examination of the right-upper field reveals decreased breath sounds. Examination of the left-upper field reveals decreased breath sounds. Examination of the right-middle field reveals decreased breath sounds. Examination of the left-middle field reveals decreased breath sounds. Examination of the right-lower field reveals decreased breath sounds. Examination of the left-lower field reveals decreased breath sounds. Decreased breath sounds and wheezing present.     Comments: Expiratory wheeze audible on auscultation  Musculoskeletal:     Cervical back: Normal range of motion and neck supple.  Skin:    General: Skin is warm and dry.     Capillary Refill: Capillary refill takes less than 2 seconds.  Neurological:     General: No focal deficit present.     Mental Status: He is alert.      UC Treatments / Results  Labs (all labs ordered are listed, but only abnormal results are displayed) Labs Reviewed - No data to  display  EKG Normal Sinus Rhythm, Rate 82 and no ST changes  Procedures Procedures (including critical care time)  Medications Ordered in UC Medications - No data to display  Initial Impression / Assessment and Plan / UC Course  I have reviewed the triage vital signs and the nursing notes.  Pertinent labs & imaging results that were available during my care of the patient were reviewed by me and considered in my  medical decision making (see chart for details).    Patient has a history of poor follow-up and compliance with medication, therefore will prescribe a 30-day supply of amlodipine and patient was secured with an appointment to see a new primary care provider at [facility] on 03/23/2023. Given chest pain and multiple risk factors for CAD/peripheral vascular disease, patient has been referred to audiology for further workup and evaluation of recurrent chest pain.  Patient has some audible expiratory wheezing on exam encourage patient to use his inhalers as previously prescribed as he reports he has inhalers but he just does not use them.  Attempted to provide preventative health education to patient regarding importance of compliance.  Patient is aware that he has an appointment scheduled with primary care and he is aware that cardiology will be reaching out to him to schedule a specialty appointment for workup and evaluation chest pain.  ED precautions given if any of his symptoms worsen or do not improve. Final Clinical Impressions(s) / UC Diagnoses   Final diagnoses:  Primary hypertension  Intermittent left-sided chest pain     Discharge Instructions      Restart Amlodipine 10 mg daily, I have prescribed enough medication to last until your upcoming appointment.   Your ECG appears normal however, given your chest pain has been recurrent and you have risk factors for heart disease I am referring you to cardiology for further work-up and evaluation of chest pain.   Their office will call you to schedule a new patient appointment.     ED Prescriptions     Medication Sig Dispense Auth. Provider   amLODipine (NORVASC) 10 MG tablet Take 1 tablet (10 mg total) by mouth daily. 30 tablet Bing Neighbors, NP      PDMP not reviewed this encounter.   Bing Neighbors, NP 03/06/23 2105

## 2023-03-05 NOTE — Discharge Instructions (Addendum)
Restart Amlodipine 10 mg daily, I have prescribed enough medication to last until your upcoming appointment.   Your ECG appears normal however, given your chest pain has been recurrent and you have risk factors for heart disease I am referring you to cardiology for further work-up and evaluation of chest pain.  Their office will call you to schedule a new patient appointment.

## 2023-03-23 ENCOUNTER — Ambulatory Visit: Payer: Self-pay | Admitting: Family Medicine

## 2023-04-23 ENCOUNTER — Ambulatory Visit: Payer: Self-pay | Admitting: Cardiology

## 2023-04-23 ENCOUNTER — Ambulatory Visit (INDEPENDENT_AMBULATORY_CARE_PROVIDER_SITE_OTHER): Payer: Self-pay | Admitting: Family Medicine

## 2023-04-23 ENCOUNTER — Encounter: Payer: Self-pay | Admitting: Family Medicine

## 2023-04-23 VITALS — BP 150/88 | HR 86 | Temp 98.0°F | Resp 16 | Ht 72.0 in | Wt 209.0 lb

## 2023-04-23 DIAGNOSIS — Z1211 Encounter for screening for malignant neoplasm of colon: Secondary | ICD-10-CM

## 2023-04-23 DIAGNOSIS — Z1159 Encounter for screening for other viral diseases: Secondary | ICD-10-CM

## 2023-04-23 DIAGNOSIS — I6381 Other cerebral infarction due to occlusion or stenosis of small artery: Secondary | ICD-10-CM

## 2023-04-23 DIAGNOSIS — E538 Deficiency of other specified B group vitamins: Secondary | ICD-10-CM

## 2023-04-23 DIAGNOSIS — J42 Unspecified chronic bronchitis: Secondary | ICD-10-CM

## 2023-04-23 DIAGNOSIS — Z7141 Alcohol abuse counseling and surveillance of alcoholic: Secondary | ICD-10-CM

## 2023-04-23 DIAGNOSIS — I10 Essential (primary) hypertension: Secondary | ICD-10-CM

## 2023-04-23 DIAGNOSIS — Z13220 Encounter for screening for lipoid disorders: Secondary | ICD-10-CM

## 2023-04-23 DIAGNOSIS — I700 Atherosclerosis of aorta: Secondary | ICD-10-CM

## 2023-04-23 DIAGNOSIS — R7309 Other abnormal glucose: Secondary | ICD-10-CM

## 2023-04-23 DIAGNOSIS — Z122 Encounter for screening for malignant neoplasm of respiratory organs: Secondary | ICD-10-CM

## 2023-04-23 DIAGNOSIS — Z114 Encounter for screening for human immunodeficiency virus [HIV]: Secondary | ICD-10-CM

## 2023-04-23 DIAGNOSIS — E559 Vitamin D deficiency, unspecified: Secondary | ICD-10-CM

## 2023-04-23 DIAGNOSIS — F17200 Nicotine dependence, unspecified, uncomplicated: Secondary | ICD-10-CM

## 2023-04-23 LAB — COMPREHENSIVE METABOLIC PANEL
ALT: 18 U/L (ref 0–53)
AST: 20 U/L (ref 0–37)
Albumin: 4.2 g/dL (ref 3.5–5.2)
Alkaline Phosphatase: 85 U/L (ref 39–117)
BUN: 15 mg/dL (ref 6–23)
CO2: 26 meq/L (ref 19–32)
Calcium: 9.4 mg/dL (ref 8.4–10.5)
Chloride: 104 meq/L (ref 96–112)
Creatinine, Ser: 0.91 mg/dL (ref 0.40–1.50)
GFR: 90.62 mL/min (ref >60.00)
Glucose, Bld: 96 mg/dL (ref 70–99)
Potassium: 3.9 meq/L (ref 3.5–5.1)
Sodium: 139 meq/L (ref 135–145)
Total Bilirubin: 0.6 mg/dL (ref 0.2–1.2)
Total Protein: 7.2 g/dL (ref 6.0–8.3)

## 2023-04-23 LAB — CBC WITH DIFFERENTIAL/PLATELET
Basophils Absolute: 0.1 10*3/uL (ref 0.0–0.1)
Basophils Relative: 0.7 % (ref 0.0–3.0)
Eosinophils Absolute: 0.2 10*3/uL (ref 0.0–0.7)
Eosinophils Relative: 1.5 % (ref 0.0–5.0)
HCT: 43.7 % (ref 39.0–52.0)
Hemoglobin: 14.6 g/dL (ref 13.0–17.0)
Lymphocytes Relative: 16 % (ref 12.0–46.0)
Lymphs Abs: 1.8 10*3/uL (ref 0.7–4.0)
MCHC: 33.3 g/dL (ref 30.0–36.0)
MCV: 102.8 fL — ABNORMAL HIGH (ref 78.0–100.0)
Monocytes Absolute: 0.9 10*3/uL (ref 0.1–1.0)
Monocytes Relative: 8.5 % (ref 3.0–12.0)
Neutro Abs: 8.1 10*3/uL — ABNORMAL HIGH (ref 1.4–7.7)
Neutrophils Relative %: 73.3 % (ref 43.0–77.0)
Platelets: 245 10*3/uL (ref 150.0–400.0)
RBC: 4.25 Mil/uL (ref 4.22–5.81)
RDW: 13.3 % (ref 11.5–15.5)
WBC: 11.1 10*3/uL — ABNORMAL HIGH (ref 4.0–10.5)

## 2023-04-23 LAB — LIPID PANEL
Cholesterol: 156 mg/dL (ref 0–200)
HDL: 57.4 mg/dL (ref >39.00)
LDL Cholesterol: 36 mg/dL (ref 0–99)
NonHDL: 98.2
Total CHOL/HDL Ratio: 3
Triglycerides: 309 mg/dL — ABNORMAL HIGH (ref 0.0–149.0)
VLDL: 61.8 mg/dL — ABNORMAL HIGH (ref 0.0–40.0)

## 2023-04-23 LAB — TSH: TSH: 0.6 u[IU]/mL (ref 0.35–5.50)

## 2023-04-23 LAB — VITAMIN B12: Vitamin B-12: 145 pg/mL — ABNORMAL LOW (ref 211–911)

## 2023-04-23 LAB — HEMOGLOBIN A1C: Hgb A1c MFr Bld: 5.3 % (ref 4.6–6.5)

## 2023-04-23 MED ORDER — AMLODIPINE BESYLATE 10 MG PO TABS: 10.0000 mg | ORAL_TABLET | Freq: Every day | ORAL | 0 refills | Status: DC

## 2023-04-23 MED ORDER — LISINOPRIL 40 MG PO TABS: 40.0000 mg | ORAL_TABLET | Freq: Every day | ORAL | 0 refills | Status: DC

## 2023-04-23 NOTE — Progress Notes (Unsigned)
SUBJECTIVE:   Chief Complaint  Patient presents with   Establish Care   HPI Presents to clinic to establish care  Discussed the use of AI scribe software for clinical note transcription with the patient, who gave verbal consent to proceed.  History of Present Illness The patient, previously under the care of Dr. Juel Burrow, presents to establish care due to a lapse in their hypertension management. They report running out of their antihypertensive medications, amlodipine and lisinopril, in September and have been unable to secure a timely appointment with their previous provider. They have been intermittently taking amlodipine provided by a friend who takes the same medication. The patient reports feeling the effects of their elevated blood pressure, including headaches. They sought care at an urgent care center where they were given lisinopril, which has since run out.  The patient reports a history of COPD but is not currently using an inhaler. They report occasional shortness of breath, which has worsened over the past month. They have been a pack-a-day smoker for 50 years and consume approximately six beers daily.  The patient also has a history of taking spironolactone for blood pressure control, which they stopped taking in July of the current year due to a perceived side effect of visual disturbances. They describe the visual disturbances as a 3D effect, similar to looking through a prism.  The patient has not had any surgeries and does not have a history of diabetes or thyroid issues. They have a family history of heart disease, with their mother having heart issues and their father having a heart attack in 21.  The patient is recently retired and reports gaining weight since retirement. They work part-time at a bar and report a significant increase in alcohol consumption since retirement.    PERTINENT PMH / PSH: As above  OBJECTIVE:  BP (!) 150/88   Pulse 86   Temp 98 F (36.7  C)   Resp 16   Ht 6' (1.829 m)   Wt 209 lb (94.8 kg)   SpO2 97%   BMI 28.35 kg/m    Physical Exam Vitals reviewed.  HENT:     Head: Normocephalic.     Right Ear: Tympanic membrane, ear canal and external ear normal.     Left Ear: Tympanic membrane, ear canal and external ear normal.     Nose: Nose normal.     Mouth/Throat:     Mouth: Mucous membranes are moist.  Eyes:     Conjunctiva/sclera: Conjunctivae normal.     Pupils: Pupils are equal, round, and reactive to light.  Neck:     Thyroid: No thyromegaly or thyroid tenderness.     Vascular: No carotid bruit.  Cardiovascular:     Rate and Rhythm: Normal rate and regular rhythm.     Pulses: Normal pulses.     Heart sounds: Normal heart sounds.  Pulmonary:     Effort: Pulmonary effort is normal.     Breath sounds: Normal breath sounds.  Abdominal:     General: Abdomen is flat. Bowel sounds are normal.     Palpations: Abdomen is soft.  Musculoskeletal:        General: Normal range of motion.     Cervical back: Normal range of motion and neck supple.     Right lower leg: No edema.     Left lower leg: No edema.  Lymphadenopathy:     Cervical: No cervical adenopathy.  Neurological:     Mental Status: He is alert.  Psychiatric:        Mood and Affect: Mood normal.        Behavior: Behavior normal.        Thought Content: Thought content normal.        Judgment: Judgment normal.        04/23/2023   10:35 AM 02/24/2022   12:26 PM 12/30/2020   11:05 AM  Depression screen PHQ 2/9  Decreased Interest 0 0 0  Down, Depressed, Hopeless 0 0 0  PHQ - 2 Score 0 0 0       No data to display          ASSESSMENT/PLAN:  Primary hypertension Assessment & Plan: Uncontrolled, off medications since September. Reports headaches when blood pressure is high. Previously on Lisinopril 40mg  and Amlodipine 10mg . Was also prescribed Spironolactone 25 mg daily but does not recall taking medication.  Not currently symptomatic. -Order  labs to check kidney function. -Refill Amlodipine 10mg  daily. -Refill Lisinopril 40mg  daily.  Orders: -     amLODIPine Besylate; Take 1 tablet (10 mg total) by mouth daily.  Dispense: 90 tablet; Refill: 0 -     Lisinopril; Take 1 tablet (40 mg total) by mouth daily.  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  Was evaluated for acute onset facial weakness. No current symptoms -Not currently on statin -Continued tobacco and EtOH use -BP not well controlled, restart BP medications. -Check labs today     Aortic atherosclerosis (HCC) Assessment & Plan: Noted on recent chest xray -Not interested in statin therapy currently -Check fasting lipids and will revisit with results -Not interested in smoking cessation assistance -Recommend limiting EtOH consumption   High dependence on smoking Assessment & Plan: Heavy smoker, 1 pack per day for 50 years. -Encourage smoking cessation. -Declined LDCT chest for lung cancer screening    Alcohol abuse counseling and surveillance Assessment & Plan: Reports daily alcohol consumption, approximately 6 beers per day for 40 years. -Monitor for signs of alcohol-related health issues.   Chronic bronchitis, unspecified chronic bronchitis type (HCC) Assessment & Plan: Chronic bronchitic changes noted on most recent chest xray Reports occasional shortness of breath. History of wheezing  and current tobacco use.  Not currently using Albuterol inhaler. -Consider starting inhaler therapy if symptoms worsen or patient is willing to quit smoking.    General Health Maintenance -Declined flu vaccine, pneumonia vaccine, colonoscopy, and lung cancer screening.  PDMP reviewed  Return in about 3 months (around 07/24/2023).  Dana Allan, MD

## 2023-04-23 NOTE — Patient Instructions (Signed)
It was a pleasure meeting you today. Thank you for allowing me to take part in your health care.  Our goals for today as we discussed include:  We will get some labs today.  If they are abnormal or we need to do something about them, I will call you.  If they are normal, I will send you a message on MyChart (if it is active) or a letter in the mail.  If you don't hear from [place] in 2 weeks, please call the office at the number below.    Refills sent for requested medications  This is a list of the screening recommended for you and due dates:  Health Maintenance  Topic Date Due   HIV Screening  Never done   Hepatitis C Screening  Never done   DTaP/Tdap/Td vaccine (1 - Tdap) Never done   Colon Cancer Screening  Never done   Screening for Lung Cancer  Never done   Zoster (Shingles) Vaccine (1 of 2) Never done   COVID-19 Vaccine (1 - 2023-24 season) Never done   Flu Shot  09/20/2023*   HPV Vaccine  Aged Out  *Topic was postponed. The date shown is not the original due date.    Follow up in 3 months  If you have any questions or concerns, please do not hesitate to call the office at [phone].  I look forward to our next visit and until then take care and stay safe.  Regards,   Dana Allan, MD   [facility]

## 2023-04-24 LAB — HIV ANTIBODY (ROUTINE TESTING W REFLEX): HIV 1&2 Ab, 4th Generation: NONREACTIVE

## 2023-04-24 LAB — HEPATITIS C ANTIBODY: Hepatitis C Ab: NONREACTIVE

## 2023-04-25 ENCOUNTER — Encounter: Payer: Self-pay | Admitting: Family Medicine

## 2023-04-25 DIAGNOSIS — J42 Unspecified chronic bronchitis: Secondary | ICD-10-CM | POA: Insufficient documentation

## 2023-04-25 DIAGNOSIS — Z13220 Encounter for screening for lipoid disorders: Secondary | ICD-10-CM | POA: Insufficient documentation

## 2023-04-25 DIAGNOSIS — I6381 Other cerebral infarction due to occlusion or stenosis of small artery: Secondary | ICD-10-CM | POA: Insufficient documentation

## 2023-04-25 DIAGNOSIS — Z1159 Encounter for screening for other viral diseases: Secondary | ICD-10-CM | POA: Insufficient documentation

## 2023-04-25 DIAGNOSIS — E559 Vitamin D deficiency, unspecified: Secondary | ICD-10-CM

## 2023-04-25 DIAGNOSIS — I700 Atherosclerosis of aorta: Secondary | ICD-10-CM | POA: Insufficient documentation

## 2023-04-25 DIAGNOSIS — R7309 Other abnormal glucose: Secondary | ICD-10-CM | POA: Insufficient documentation

## 2023-04-25 DIAGNOSIS — Z114 Encounter for screening for human immunodeficiency virus [HIV]: Secondary | ICD-10-CM | POA: Insufficient documentation

## 2023-04-25 DIAGNOSIS — Z1211 Encounter for screening for malignant neoplasm of colon: Secondary | ICD-10-CM | POA: Insufficient documentation

## 2023-04-25 DIAGNOSIS — E538 Deficiency of other specified B group vitamins: Secondary | ICD-10-CM | POA: Insufficient documentation

## 2023-04-25 HISTORY — DX: Vitamin D deficiency, unspecified: E55.9

## 2023-04-25 MED ORDER — VITAMIN B-12 1000 MCG PO TABS: 1000.0000 ug | ORAL_TABLET | Freq: Every day | ORAL | 3 refills | Status: DC

## 2023-04-25 NOTE — Assessment & Plan Note (Signed)
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  Was evaluated for acute onset facial weakness. No current symptoms -Not currently on statin -Continued tobacco and EtOH use -BP not well controlled, restart BP medications. -Check labs today

## 2023-04-25 NOTE — Assessment & Plan Note (Signed)
Noted on recent chest xray -Not interested in statin therapy currently -Check fasting lipids and will revisit with results -Not interested in smoking cessation assistance -Recommend limiting EtOH consumption

## 2023-04-25 NOTE — Assessment & Plan Note (Addendum)
Uncontrolled, off medications since September. Reports headaches when blood pressure is high. Previously on Lisinopril 40mg  and Amlodipine 10mg . Was also prescribed Spironolactone 25 mg daily but does not recall taking medication.  Not currently symptomatic. -Order labs to check kidney function. -Refill Amlodipine 10mg  daily. -Refill Lisinopril 40mg  daily.

## 2023-04-25 NOTE — Assessment & Plan Note (Signed)
Heavy smoker, 1 pack per day for 50 years. -Encourage smoking cessation. -Declined LDCT chest for lung cancer screening

## 2023-04-25 NOTE — Assessment & Plan Note (Signed)
Chronic bronchitic changes noted on most recent chest xray Reports occasional shortness of breath. History of wheezing and current tobacco use.  Not currently using Albuterol inhaler. -Consider starting inhaler therapy if symptoms worsen or patient is willing to quit smoking.

## 2023-04-25 NOTE — Assessment & Plan Note (Signed)
Reports daily alcohol consumption, approximately 6 beers per day for 40 years. -Monitor for signs of alcohol-related health issues.

## 2023-05-01 ENCOUNTER — Other Ambulatory Visit: Payer: Self-pay | Admitting: Family Medicine

## 2023-05-01 DIAGNOSIS — E785 Hyperlipidemia, unspecified: Secondary | ICD-10-CM

## 2023-05-01 DIAGNOSIS — I6381 Other cerebral infarction due to occlusion or stenosis of small artery: Secondary | ICD-10-CM

## 2023-05-01 MED ORDER — ROSUVASTATIN CALCIUM 10 MG PO TABS: 10.0000 mg | ORAL_TABLET | Freq: Every day | ORAL | 3 refills | Status: AC

## 2023-07-05 ENCOUNTER — Ambulatory Visit: Payer: Self-pay | Admitting: Cardiology

## 2023-07-19 ENCOUNTER — Telehealth: Payer: Self-pay

## 2023-07-19 ENCOUNTER — Other Ambulatory Visit: Payer: Self-pay | Admitting: Family Medicine

## 2023-07-19 DIAGNOSIS — I10 Essential (primary) hypertension: Secondary | ICD-10-CM
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
## 2023-07-26 ENCOUNTER — Ambulatory Visit (INDEPENDENT_AMBULATORY_CARE_PROVIDER_SITE_OTHER): Payer: Self-pay | Admitting: Family Medicine

## 2023-07-26 ENCOUNTER — Encounter: Payer: Self-pay | Admitting: Family Medicine

## 2023-07-26 VITALS — BP 126/82 | HR 75 | Temp 98.0°F | Resp 18 | Ht 72.0 in | Wt 219.2 lb

## 2023-07-26 DIAGNOSIS — E538 Deficiency of other specified B group vitamins: Secondary | ICD-10-CM

## 2023-07-26 DIAGNOSIS — I10 Essential (primary) hypertension: Secondary | ICD-10-CM

## 2023-07-26 DIAGNOSIS — E781 Pure hyperglyceridemia: Secondary | ICD-10-CM | POA: Insufficient documentation

## 2023-07-26 DIAGNOSIS — Z720 Tobacco use: Secondary | ICD-10-CM | POA: Insufficient documentation

## 2023-07-26 DIAGNOSIS — I700 Atherosclerosis of aorta: Secondary | ICD-10-CM

## 2023-07-26 DIAGNOSIS — J449 Chronic obstructive pulmonary disease, unspecified: Secondary | ICD-10-CM | POA: Insufficient documentation

## 2023-07-26 MED ORDER — BREZTRI AEROSPHERE 160-9-4.8 MCG/ACT IN AERO: 2.0000 | INHALATION_SPRAY | Freq: Two times a day (BID) | RESPIRATORY_TRACT | Status: DC

## 2023-07-26 NOTE — Assessment & Plan Note (Signed)
B12 level was low (182). Currently taking B12 supplement, but unsure of dosage. -Continue Vit B12 supplement to at least 1000mg  daily.

## 2023-07-26 NOTE — Progress Notes (Signed)
SUBJECTIVE:   Chief Complaint  Patient presents with   Hypertension   HPI Presents for follow up chronic disease management  Discussed the use of AI scribe software for clinical note transcription with the patient, who gave verbal consent to proceed.  History of Present Illness The patient presents for a follow-up on hypertension management.  Blood pressure has been stable with the use of lisinopril and amlodipine, taken daily, without any concerns regarding these medications.  They experience shortness of breath, particularly when lying down, and sometimes coughing leads to visual disturbances described as seeing 'white'. They attribute this to smoking and are attempting to reduce their smoking, having managed to make a pack last the entire weekend. They have a history of quitting smoking for ten years and are trying to cut down again.  They are on Crestor for high triglyceride levels and report no side effects such as muscle aches or joint pains. They are compliant with this medication and have no issues with refills.  They take B12 supplements obtained from [facility] but are unsure of the dosage. Their B12 level was previously low at 182, possibly due to alcohol consumption, as they run a bar and drink regularly. They plan to check the dosage to ensure it is at least 1000 mcg daily.      PERTINENT PMH / PSH: As above  OBJECTIVE:  BP 126/82   Pulse 75   Temp 98 F (36.7 C)   Resp 18   Ht 6' (1.829 m)   Wt 219 lb 4 oz (99.5 kg)   SpO2 100%   BMI 29.74 kg/m    Physical Exam Vitals reviewed.  Constitutional:      General: He is not in acute distress.    Appearance: Normal appearance. He is normal weight. He is not ill-appearing, toxic-appearing or diaphoretic.  Eyes:     General:        Right eye: No discharge.        Left eye: No discharge.  Cardiovascular:     Rate and Rhythm: Normal rate and regular rhythm.     Heart sounds: Normal heart sounds.  Pulmonary:      Effort: Pulmonary effort is normal.     Breath sounds: Wheezing and rhonchi present.  Musculoskeletal:        General: Normal range of motion.     Cervical back: Normal range of motion.  Skin:    General: Skin is warm and dry.  Neurological:     Mental Status: He is alert and oriented to person, place, and time. Mental status is at baseline.  Psychiatric:        Mood and Affect: Mood normal.        Behavior: Behavior normal.        Thought Content: Thought content normal.        Judgment: Judgment normal.           04/23/2023   10:35 AM 02/24/2022   12:26 PM 12/30/2020   11:05 AM  Depression screen PHQ 2/9  Decreased Interest 0 0 0  Down, Depressed, Hopeless 0 0 0  PHQ - 2 Score 0 0 0       No data to display          ASSESSMENT/PLAN:  Chronic obstructive pulmonary disease, unspecified COPD type (HCC) Assessment & Plan: Reports shortness of breath, particularly when lying down. Has an old inhaler. -Provide trial of Breztri and monitor for improvement. -Consider need for long-term  control if shortness of breath persists despite smoking cessation.  Orders: -     Breztri Aerosphere; Inhale 2 puffs into the lungs in the morning and at bedtime.  Primary hypertension Assessment & Plan: Well controlled on Lisinopril and Amlodipine. No chest pain reported. -Continue Lisinopril and Amlodipine as prescribed.   Tobacco use Assessment & Plan: Reports shortness of breath due to smoking. Currently attempting to decrease smoking frequency. -Encouraged continued efforts to decrease and ultimately quit smoking.   Vitamin B 12 deficiency Assessment & Plan: B12 level was low (182). Currently taking B12 supplement, but unsure of dosage. -Continue Vit B12 supplement to at least 1000mg  daily.   Aortic atherosclerosis (HCC) Assessment & Plan: Continue statin   Hypertriglyceridemia Assessment & Plan: On Crestor for high triglyceride levels. -Continue Crestor as  prescribed. -Check triglyceride levels at next visit.    PDMP reviewed  Return if symptoms worsen or fail to improve, for PCP.  Dana Allan, MD

## 2023-07-26 NOTE — Assessment & Plan Note (Addendum)
Well controlled on Lisinopril and Amlodipine. No chest pain reported. -Continue Lisinopril and Amlodipine as prescribed.

## 2023-07-26 NOTE — Assessment & Plan Note (Signed)
Reports shortness of breath, particularly when lying down. Has an old inhaler. -Provide trial of Breztri and monitor for improvement. -Consider need for long-term control if shortness of breath persists despite smoking cessation.

## 2023-07-26 NOTE — Patient Instructions (Addendum)
It was a pleasure meeting you today. Thank you for allowing me to take part in your health care.  Our goals for today as we discussed include:  Continue current blood pressure medication  Trial Breztri 2 puff 2 times a day for shortness of breath  Congratulations on your decision to decrease smoking to quit.   This is a list of the screening recommended for you and due dates:  Health Maintenance  Topic Date Due   Pneumococcal Vaccination (1 of 2 - PCV) Never done   COVID-19 Vaccine (1 - 2024-25 season) Never done   Zoster (Shingles) Vaccine (1 of 2) 07/26/2023*   Flu Shot  09/20/2023*   Screening for Lung Cancer  04/24/2024*   DTaP/Tdap/Td vaccine (1 - Tdap) 04/24/2024*   Colon Cancer Screening  04/24/2024*   Hepatitis C Screening  Completed   HIV Screening  Completed   HPV Vaccine  Aged Out  *Topic was postponed. The date shown is not the original due date.     If you have any questions or concerns, please do not hesitate to call the office at [phone].  I look forward to our next visit and until then take care and stay safe.  Regards,   Dana Allan, MD   [facility]

## 2023-07-26 NOTE — Assessment & Plan Note (Signed)
On Crestor for high triglyceride levels. -Continue Crestor as prescribed. -Check triglyceride levels at next visit.

## 2023-07-26 NOTE — Assessment & Plan Note (Signed)
Reports shortness of breath due to smoking. Currently attempting to decrease smoking frequency. -Encouraged continued efforts to decrease and ultimately quit smoking.

## 2023-07-26 NOTE — Assessment & Plan Note (Signed)
 Continue statin.

## 2023-10-21 ENCOUNTER — Other Ambulatory Visit: Payer: Self-pay | Admitting: Family Medicine

## 2023-10-21 DIAGNOSIS — I10 Essential (primary) hypertension: Secondary | ICD-10-CM

## 2023-10-26 ENCOUNTER — Telehealth: Payer: Self-pay | Admitting: Family Medicine

## 2023-10-26 NOTE — Telephone Encounter (Signed)
 Called pt and advised him that his medication is ready for pick up at the pharmacy.

## 2023-10-26 NOTE — Telephone Encounter (Signed)
 Prescription Request  10/26/2023  LOV: 07/26/2023  What is the name of the medication or equipment?  amLODipine  (NORVASC ) 10 MG tablet Budeson-Glycopyrrol-Formoterol (BREZTRI  AEROSPHERE) 160-9-4.8 MCG/ACT AERO cyanocobalamin (VITAMIN B12) 1000 MCG tablet  lisinopril  (ZESTRIL ) 40 MG tablet     Have you contacted your pharmacy to request a refill? No   Which pharmacy would you like this sent to?  [id number] - [place], [place] - [address] AT [facility] [address] [facility] [address] [place] [address] Phone: [phone] Fax: [phone]  [id number] [place], [place] - [address] AT [facility] OF [address] [place] [address] Phone: [phone] Fax: [phone]    Patient notified that their request is being sent to the clinical staff for review and that they should receive a response within 2 business days.   Please advise at [facility] [phone]   *pt states he has 3 pills left of both meds. Needs refill ASAP. [facility] is the 1st listed at corner of [address].

## 2024-01-24 ENCOUNTER — Other Ambulatory Visit: Payer: Self-pay

## 2024-01-24 DIAGNOSIS — I10 Essential (primary) hypertension: Secondary | ICD-10-CM

## 2024-01-24 MED ORDER — LISINOPRIL 40 MG PO TABS: 40.0000 mg | ORAL_TABLET | Freq: Every day | ORAL | 0 refills | Status: DC

## 2024-02-07 ENCOUNTER — Other Ambulatory Visit: Payer: Self-pay

## 2024-02-07 DIAGNOSIS — I10 Essential (primary) hypertension: Secondary | ICD-10-CM

## 2024-02-07 MED ORDER — AMLODIPINE BESYLATE 10 MG PO TABS: 10.0000 mg | ORAL_TABLET | Freq: Every day | ORAL | 0 refills | Status: DC

## 2024-02-22 ENCOUNTER — Encounter: Payer: Self-pay | Admitting: Nurse Practitioner

## 2024-02-29 ENCOUNTER — Other Ambulatory Visit: Payer: Self-pay | Admitting: Nurse Practitioner

## 2024-02-29 DIAGNOSIS — I10 Essential (primary) hypertension: Secondary | ICD-10-CM

## 2024-03-06 ENCOUNTER — Ambulatory Visit: Payer: Self-pay

## 2024-03-06 ENCOUNTER — Ambulatory Visit (INDEPENDENT_AMBULATORY_CARE_PROVIDER_SITE_OTHER): Payer: Self-pay

## 2024-03-06 VITALS — BP 118/80 | HR 84 | Temp 98.4°F | Ht 71.0 in | Wt 216.6 lb

## 2024-03-06 DIAGNOSIS — Z2821 Immunization not carried out because of patient refusal: Secondary | ICD-10-CM

## 2024-03-06 DIAGNOSIS — E538 Deficiency of other specified B group vitamins: Secondary | ICD-10-CM

## 2024-03-06 DIAGNOSIS — Z720 Tobacco use: Secondary | ICD-10-CM

## 2024-03-06 DIAGNOSIS — J449 Chronic obstructive pulmonary disease, unspecified: Secondary | ICD-10-CM

## 2024-03-06 DIAGNOSIS — I10 Essential (primary) hypertension: Secondary | ICD-10-CM

## 2024-03-06 DIAGNOSIS — E782 Mixed hyperlipidemia: Secondary | ICD-10-CM

## 2024-03-06 DIAGNOSIS — H6121 Impacted cerumen, right ear: Secondary | ICD-10-CM

## 2024-03-06 LAB — VITAMIN B12: Vitamin B-12: 162 pg/mL — ABNORMAL LOW (ref 211–911)

## 2024-03-06 LAB — CBC WITH DIFFERENTIAL/PLATELET
Basophils Absolute: 0 K/uL (ref 0.0–0.1)
Basophils Relative: 0.5 % (ref 0.0–3.0)
Eosinophils Absolute: 0.1 K/uL (ref 0.0–0.7)
Eosinophils Relative: 1 % (ref 0.0–5.0)
HCT: 40.5 % (ref 39.0–52.0)
Hemoglobin: 14 g/dL (ref 13.0–17.0)
Lymphocytes Relative: 20.4 % (ref 12.0–46.0)
Lymphs Abs: 1.4 K/uL (ref 0.7–4.0)
MCHC: 34.6 g/dL (ref 30.0–36.0)
MCV: 98.7 fl (ref 78.0–100.0)
Monocytes Absolute: 0.6 K/uL (ref 0.1–1.0)
Monocytes Relative: 7.9 % (ref 3.0–12.0)
Neutro Abs: 4.9 K/uL (ref 1.4–7.7)
Neutrophils Relative %: 70.2 % (ref 43.0–77.0)
Platelets: 199 K/uL (ref 150.0–400.0)
RBC: 4.11 Mil/uL — ABNORMAL LOW (ref 4.22–5.81)
RDW: 13.7 % (ref 11.5–15.5)
WBC: 7 K/uL (ref 4.0–10.5)

## 2024-03-06 LAB — BASIC METABOLIC PANEL WITH GFR
BUN: 11 mg/dL (ref 6–23)
CO2: 31 meq/L (ref 19–32)
Calcium: 9.5 mg/dL (ref 8.4–10.5)
Chloride: 99 meq/L (ref 96–112)
Creatinine, Ser: 0.82 mg/dL (ref 0.40–1.50)
GFR: 93.87 mL/min (ref >60.00)
Glucose, Bld: 103 mg/dL — ABNORMAL HIGH (ref 70–99)
Potassium: 4.3 meq/L (ref 3.5–5.1)
Sodium: 139 meq/L (ref 135–145)

## 2024-03-06 LAB — LIPID PANEL
Cholesterol: 167 mg/dL (ref 0–200)
HDL: 61.8 mg/dL (ref >39.00)
LDL Cholesterol: 71 mg/dL (ref 0–99)
NonHDL: 105.23
Total CHOL/HDL Ratio: 3
Triglycerides: 170 mg/dL — ABNORMAL HIGH (ref 0.0–149.0)
VLDL: 34 mg/dL (ref 0.0–40.0)

## 2024-03-06 MED ORDER — PREDNISONE 20 MG PO TABS: 20.0000 mg | ORAL_TABLET | Freq: Every day | ORAL | 0 refills | Status: AC

## 2024-03-06 MED ORDER — LISINOPRIL 40 MG PO TABS: 40.0000 mg | ORAL_TABLET | Freq: Every day | ORAL | 3 refills | Status: AC

## 2024-03-06 MED ORDER — BREZTRI AEROSPHERE 160-9-4.8 MCG/ACT IN AERO: 2.0000 | INHALATION_SPRAY | Freq: Two times a day (BID) | RESPIRATORY_TRACT | 6 refills | Status: AC

## 2024-03-06 MED ORDER — VITAMIN B-12 1000 MCG PO TABS
2500.0000 ug | ORAL_TABLET | Freq: Every day | ORAL | 1 refills | Status: AC
Start: 2024-03-06 — End: ?

## 2024-03-06 MED ORDER — EZETIMIBE 10 MG PO TABS: 10.0000 mg | ORAL_TABLET | Freq: Every day | ORAL | 3 refills | Status: AC

## 2024-03-06 MED ORDER — AMLODIPINE BESYLATE 10 MG PO TABS: 10.0000 mg | ORAL_TABLET | Freq: Every day | ORAL | 3 refills | Status: DC

## 2024-03-06 MED ORDER — ALBUTEROL SULFATE HFA 108 (90 BASE) MCG/ACT IN AERS: 2.0000 | INHALATION_SPRAY | Freq: Four times a day (QID) | RESPIRATORY_TRACT | 3 refills | Status: AC | PRN

## 2024-03-06 NOTE — Assessment & Plan Note (Signed)
 Low vitamin B12 levels previously treated with oral supplementation. Patient stopped taking B12 supplement on his own.  Prefers oral over injections.Repeat B12 level to assess current status. If B12 remains low, recommend oral B12 supplementation at 2500 mcg daily. Consider B12 injections if oral supplementation is insufficient.

## 2024-03-06 NOTE — Assessment & Plan Note (Signed)
 Chronic tobacco use with one pack per day. Uninterested in quitting. Discussed smoking's impact on COPD and overall health, emphasizing cessation benefits.

## 2024-03-06 NOTE — Progress Notes (Signed)
 Last mychart login by patient: 02/15/24  Please call the patient to let him know his b12 continues to be low. Start taking 2500 mcg vitamin B 12 daily till his next visit with me. Prescription sent to the pharmacy so that he can continue to take it once he runs out of old B 12 injection.  Patient prefers taking B12 supplement over getting IM injection.   His kidney function, electrolytes looks stable. Cholesterol shows elevation in triglycerides. Start Zetia  as recommended during his visit today.   Thank you,  Luke Shade, MD

## 2024-03-06 NOTE — Patient Instructions (Addendum)
--   I am repeating your labs for B12, cholesterol, CBC. If your B12 is low I recommend we start with taking B 12,  2500 mcg daily to reduce risk of dementia, neuropathy.   -- I also recommend we start you on cholesterol lowering medication called Zetia , 10 mg, take one tablet daily.   -- Use your inhaler Breztri , two puffs, twice a day. And use Albuterol  inhaler when you develop wheezing, worsening cough. For now I recommend we treat you with Prednisone  20 mg, for 5 days, take it in the morning with food to help reduce worsening cough. I am also referring you to lung to doctor to do breathing test. The lung doctor office will call you to schedule an appointment.   -- Use Debrox ear drops, you can get this over the counter, use about 5 drops of 6.5%  Debrox  twice daily for up to 4 days. If you notice improvement in clogging, hearing you don't have to come for ear flush. If you continue to fill clogging sensation please schedule a nurse visit to get ear wax flushed. Schedule appointment with a nurse anytime from Monday 8 AM-5 PM till Friday 8 AM to 11 AM.   -- Continue current blood pressure medication.

## 2024-03-06 NOTE — Assessment & Plan Note (Signed)
 Well controlled on Lisinopril  40 mg daily and Amlodipine  10 mg daily. Continue, check BMP, refills sent.  No chest pain reported.

## 2024-03-06 NOTE — Assessment & Plan Note (Signed)
 Adverse reaction to Rosuvastatin , with hallucinations. Requires cholesterol management without statins. Prescribe Zetia  10 mg daily. Repeat lipid panel today. F/u in 6 months.

## 2024-03-06 NOTE — Assessment & Plan Note (Signed)
 Smoking, 1 pack of cigarette daily. Likely contributing to COPD symptoms. Currently with increased cough, wheezing since working with hay. Not interested in smoking cessation counseling.  - Prescribed albuterol  inhaler every 6-8 hours as needed when wheezing, cough worsens from baseline.  - Prescribed Breztri  inhaler, two puffs twice daily, with mouth rinsing after use.  - Prescribed prednisone  20 mg daily for 5 days. - Referred to pulmonologist for further evaluation and pulmonary function testing. - Advised on smoking cessation for symptom improvement and lung health.

## 2024-03-06 NOTE — Assessment & Plan Note (Signed)
 Use OTC Debrox ear drops, about 5 drops ( 6.5% Debrox) twice daily for up to 4 days in a row. If improvement in clogging, no appointment for nurse visit recommended. If no improvement in symptoms recommend schedule nurse visit when I am in the clinic gor right ear irrigation. Patient to call office to schedule nurse visit.

## 2024-03-06 NOTE — Assessment & Plan Note (Signed)
 Patient is due for shingles, pneumonia, covid-19, influenza immunization. He was counseled on increased r/o pneumonia given h/o smoking and counseled on updating immunization which he declined.

## 2024-03-06 NOTE — Progress Notes (Signed)
 Established Patient Office Visit TOC from Dr. Hope    Subjective  Patient ID: Dale Bentley, male    DOB: 08-28-1960  Age: 63 y.o. MRN: [id number]  Chief Complaint  Patient presents with   Establish Care    He  has a past medical history of Bell's palsy, Hypertension, and Vitamin D deficiency (04/25/2023).  HPI Discussed the use of AI scribe software for clinical note transcription with the patient, who gave verbal consent to proceed.  History of Present Illness Dale Bentley is a 63 year old male with COPD, HTN, hyperlipidemia who presents for TOC.  He has been experiencing a persistent cough for approximately two weeks, which began with the start of hay season. The cough is exacerbated by exposure to hay and dust and is sometimes accompanied by phlegm, which is occasionally discolored. The cough worsens at night, disturbing his sleep. 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  He has a history of COPD and smokes about a pack of cigarettes a day. He consumes approximately six cans of beer daily. He has previously seen a lung doctor and has had breathing tests done in the past. He has not attempted to quit smoking.  He is currently taking amlodipine  and lisinopril  for blood pressure management. He has experienced adverse effects from rosuvastatin , including hallucinations, and has discontinued its use. He is not interested in receiving vaccinations for flu, shingles, or pneumonia.  He reports a history of low B12 levels, which were previously treated with oral supplements. He has not eaten today and mentions that his white blood cell count was slightly elevated during his last lab work, which he attributes to long-term smoking.  He experiences ear discomfort, particularly in the right ear, which he attributes to wax buildup. He has had his ears flushed in the past and wears earplugs regularly. The ear has  been bothering him for about a month.  ROS As per HPI    Objective:     BP 118/80 (BP Location: Right Arm, Patient Position: Sitting, Cuff Size: Normal)   Pulse 84   Temp 98.4 F (36.9 C) (Oral)   Ht 5' 11 (1.803 m)   Wt 216 lb 9.6 oz (98.2 kg)   SpO2 95%   BMI 30.21 kg/m      03/06/2024    8:47 AM 04/23/2023   10:35 AM 02/24/2022   12:26 PM  Depression screen PHQ 2/9  Decreased Interest 0 0 0  Down, Depressed, Hopeless 0 0 0  PHQ - 2 Score 0 0 0  Altered sleeping 1    Tired, decreased energy 1    Change in appetite 0    Feeling bad or failure about yourself  0    Trouble concentrating 0    Moving slowly or fidgety/restless 0    Suicidal thoughts 0    PHQ-9 Score 2    Difficult doing work/chores Not difficult at all        03/06/2024    8:47 AM  GAD 7 : Generalized Anxiety Score  Nervous, Anxious, on Edge 0  Control/stop worrying 0  Worry too much - different things 0  Trouble relaxing 0  Restless 0  Easily annoyed or irritable 0  Afraid - awful might happen 0  Total GAD 7 Score 0  Anxiety Difficulty Not difficult at all      03/06/2024    8:47 AM 04/23/2023  10:35 AM 02/24/2022   12:26 PM  Depression screen PHQ 2/9  Decreased Interest 0 0 0  Down, Depressed, Hopeless 0 0 0  PHQ - 2 Score 0 0 0  Altered sleeping 1    Tired, decreased energy 1    Change in appetite 0    Feeling bad or failure about yourself  0    Trouble concentrating 0    Moving slowly or fidgety/restless 0    Suicidal thoughts 0    PHQ-9 Score 2    Difficult doing work/chores Not difficult at all        03/06/2024    8:47 AM  GAD 7 : Generalized Anxiety Score  Nervous, Anxious, on Edge 0  Control/stop worrying 0  Worry too much - different things 0  Trouble relaxing 0  Restless 0  Easily annoyed or irritable 0  Afraid - awful might happen 0  Total GAD 7 Score 0  Anxiety Difficulty Not difficult at all   SDOH Screenings   Depression (PHQ2-9): Low Risk  (03/06/2024)   Tobacco Use: High Risk (03/06/2024)     Physical Exam Constitutional:      Appearance: He is obese.  HENT:     Head: Normocephalic and atraumatic.     Right Ear: External ear normal. There is impacted cerumen.     Left Ear: External ear normal. There is no impacted cerumen.     Mouth/Throat:     Mouth: Mucous membranes are moist.  Cardiovascular:     Rate and Rhythm: Normal rate.  Pulmonary:     Effort: Pulmonary effort is normal. No respiratory distress.     Breath sounds: Wheezing (inspiratory and expiratoy wheezes) present. No rales.  Abdominal:     General: Bowel sounds are normal.     Tenderness: There is no guarding.  Musculoskeletal:     Cervical back: Neck supple.     Right lower leg: No edema.     Left lower leg: No edema.  Lymphadenopathy:     Cervical: No cervical adenopathy.  Neurological:     Mental Status: He is alert and oriented to person, place, and time.  Psychiatric:        Mood and Affect: Mood normal.        No results found for any visits on 03/06/24.  The 10-year ASCVD risk score (Arnett DK, et al., 2019) is: 11.5%    Following labs reviewed:  Component     Latest Ref Rng 04/23/2023  WBC     4.0 - 10.5 K/uL 11.1 (H)   RBC     4.22 - 5.81 Mil/uL 4.25   Hemoglobin     13.0 - 17.0 g/dL 85.3   HCT     60.9 - 47.9 % 43.7   MCV     78.0 - 100.0 fl 102.8 (H)   MCHC     30.0 - 36.0 g/dL 66.6   RDW     88.4 - 84.4 % 13.3   Platelets     150.0 - 400.0 K/uL 245.0   Neutrophils     43.0 - 77.0 % 73.3   Lymphocytes     12.0 - 46.0 % 16.0   Monocytes Relative     3.0 - 12.0 % 8.5   Eosinophil     0.0 - 5.0 % 1.5   Basophil     0.0 - 3.0 % 0.7   NEUT#     1.4 - 7.7 K/uL 8.1 (H)  Lymphs Abs     0.7 - 4.0 K/uL 1.8   Monocyte #     0.1 - 1.0 K/uL 0.9   Eosinophils Absolute     0.0 - 0.7 K/uL 0.2   Basophils Absolute     0.0 - 0.1 K/uL 0.1   Sodium     135 - 145 mEq/L 139   Potassium     3.5 - 5.1 mEq/L 3.9   Chloride     96  - 112 mEq/L 104   CO2     19 - 32 mEq/L 26   Glucose     70 - 99 mg/dL 96   BUN     6 - 23 mg/dL 15   Creatinine     9.59 - 1.50 mg/dL 9.08   Total Bilirubin     0.2 - 1.2 mg/dL 0.6   Alkaline Phosphatase     39 - 117 U/L 85   AST     0 - 37 U/L 20   ALT     0 - 53 U/L 18   Total Protein     6.0 - 8.3 g/dL 7.2   Albumin     3.5 - 5.2 g/dL 4.2   GFR     >39.99 mL/min 90.62   Calcium      8.4 - 10.5 mg/dL 9.4   Cholesterol     0 - 200 mg/dL 843   Triglycerides     0.0 - 149.0 mg/dL 690.9 (H)   HDL Cholesterol     >39.00 mg/dL 42.59   VLDL     0.0 - 40.0 mg/dL 38.1 (H)   LDL (calc)     0 - 99 mg/dL 36   Total CHOL/HDL Ratio 3   NonHDL 98.20   Hemoglobin A1C     4.6 - 6.5 % 5.3   Vitamin B12     211 - 911 pg/mL 145 (L)    Assessment & Plan:   Mixed hyperlipidemia Assessment & Plan: Adverse reaction to Rosuvastatin , with hallucinations. Requires cholesterol management without statins. Prescribe Zetia  10 mg daily. Repeat lipid panel today. F/u in 6 months.   Orders: -     Lipid panel -     Ezetimibe ; Take 1 tablet (10 mg total) by mouth daily.  Dispense: 90 tablet; Refill: 3  Chronic obstructive pulmonary disease, unspecified COPD type (HCC) Assessment & Plan: Smoking, 1 pack of cigarette daily. Likely contributing to COPD symptoms. Currently with increased cough, wheezing since working with hay. Not interested in smoking cessation counseling.  - Prescribed albuterol  inhaler every 6-8 hours as needed when wheezing, cough worsens from baseline.  - Prescribed Breztri  inhaler, two puffs twice daily, with mouth rinsing after use.  - Prescribed prednisone  20 mg daily for 5 days. - Referred to pulmonologist for further evaluation and pulmonary function testing. - Advised on smoking cessation for symptom improvement and lung health.  Orders: -     Breztri  Aerosphere; Inhale 2 puffs into the lungs in the morning and at bedtime.  Dispense: 10.7 g; Refill: 6 -      Pulmonary Visit -     predniSONE ; Take 1 tablet (20 mg total) by mouth daily with breakfast for 5 days.  Dispense: 5 tablet; Refill: 0  Vitamin B 12 deficiency Assessment & Plan: Low vitamin B12 levels previously treated with oral supplementation. Patient stopped taking B12 supplement on his own.  Prefers oral over injections.Repeat B12 level to assess current status. If B12 remains low,  recommend oral B12 supplementation at 2500 mcg daily. Consider B12 injections if oral supplementation is insufficient.  Orders: -     CBC with Differential/Platelet -     Vitamin B12  Primary hypertension Assessment & Plan: Well controlled on Lisinopril  40 mg daily and Amlodipine  10 mg daily. Continue, check BMP, refills sent.  No chest pain reported.  Orders: -     Basic metabolic panel with GFR -     amLODIPine  Besylate; Take 1 tablet (10 mg total) by mouth daily.  Dispense: 90 tablet; Refill: 3 -     Lisinopril ; Take 1 tablet (40 mg total) by mouth daily.  Dispense: 90 tablet; Refill: 3  Immunization declined Assessment & Plan: Patient is due for shingles, pneumonia, covid-19, influenza immunization. He was counseled on increased r/o pneumonia given h/o smoking and counseled on updating immunization which he declined.    Impacted cerumen of right ear Assessment & Plan: Use OTC Debrox ear drops, about 5 drops ( 6.5% Debrox) twice daily for up to 4 days in a row. If improvement in clogging, no appointment for nurse visit recommended. If no improvement in symptoms recommend schedule nurse visit when I am in the clinic gor right ear irrigation. Patient to call office to schedule nurse visit.    Tobacco use Assessment & Plan: Chronic tobacco use with one pack per day. Uninterested in quitting. Discussed smoking's impact on COPD and overall health, emphasizing cessation benefits.   Other orders -     Albuterol  Sulfate HFA; Inhale 2 puffs into the lungs every 6 (six) hours as needed for wheezing or  shortness of breath.  Dispense: 8 g; Refill: 3    Return in about 6 months (around 09/03/2024) for Chronic follow up .   Luke Shade, MD

## 2024-03-09 ENCOUNTER — Telehealth: Payer: Self-pay

## 2024-03-09 DIAGNOSIS — H6121 Impacted cerumen, right ear: Secondary | ICD-10-CM

## 2024-03-09 NOTE — Telephone Encounter (Signed)
 Called patient to f/o on Right impacted cerumen. He was recommended to start using Debrox 5 drops to right eat BID for 4 days and schedule nurse visit when I am in the clinic for cerumen extraction. However, patient has not been using Debrox (reports pharmacy did not have it). Continues to have clogging sensation on right ear, is interested in nurse visit for cerumen removal. I counseled patient on r/o cerumen removal including rupture of tympanic membrane. Patient requesting nurse visit for cerumen removal. Please assist patient to help schedule nurse visit from M-T (all day) or Friday AM when I am in the clinic.   Thank you,  Luke Shade, MD

## 2024-05-06 ENCOUNTER — Other Ambulatory Visit: Payer: Self-pay | Admitting: Nurse Practitioner

## 2024-05-06 DIAGNOSIS — I10 Essential (primary) hypertension: Secondary | ICD-10-CM

## 2024-09-04 ENCOUNTER — Ambulatory Visit: Payer: Self-pay

## 2025-09-03 ENCOUNTER — Ambulatory Visit: Payer: Self-pay
